# Patient Record
Sex: Female | Born: 1975 | Race: Black or African American | Hispanic: No | Smoking: Current every day smoker
Health system: Southern US, Community
[De-identification: ages and names within clinical notes are randomized; demographics above are authoritative.]

## PROBLEM LIST (undated history)

## (undated) DIAGNOSIS — N939 Abnormal uterine and vaginal bleeding, unspecified: Secondary | ICD-10-CM

## (undated) DIAGNOSIS — G629 Polyneuropathy, unspecified: Secondary | ICD-10-CM

## (undated) DIAGNOSIS — F419 Anxiety disorder, unspecified: Secondary | ICD-10-CM

## (undated) DIAGNOSIS — G43909 Migraine, unspecified, not intractable, without status migrainosus: Secondary | ICD-10-CM

## (undated) HISTORY — DX: Abnormal uterine and vaginal bleeding, unspecified: N93.9

## (undated) HISTORY — PX: TUBAL LIGATION: SHX77

## (undated) HISTORY — PX: STERILIZATION: SHX533

## (undated) HISTORY — DX: Anxiety disorder, unspecified: F41.9

## (undated) HISTORY — DX: Polyneuropathy, unspecified: G62.9

---

## 2002-06-22 ENCOUNTER — Emergency Department (HOSPITAL_COMMUNITY): Admission: EM | Admit: 2002-06-22 | Discharge: 2002-06-22 | Payer: Self-pay | Admitting: Emergency Medicine

## 2004-07-02 ENCOUNTER — Emergency Department (HOSPITAL_COMMUNITY): Admission: EM | Admit: 2004-07-02 | Discharge: 2004-07-02 | Payer: Self-pay | Admitting: Family Medicine

## 2004-07-10 ENCOUNTER — Emergency Department (HOSPITAL_COMMUNITY): Admission: EM | Admit: 2004-07-10 | Discharge: 2004-07-10 | Payer: Self-pay | Admitting: Emergency Medicine

## 2004-07-16 ENCOUNTER — Ambulatory Visit (HOSPITAL_BASED_OUTPATIENT_CLINIC_OR_DEPARTMENT_OTHER): Admission: RE | Admit: 2004-07-16 | Discharge: 2004-07-16 | Payer: Self-pay | Admitting: Orthopedic Surgery

## 2004-08-01 ENCOUNTER — Emergency Department (HOSPITAL_COMMUNITY): Admission: EM | Admit: 2004-08-01 | Discharge: 2004-08-02 | Payer: Self-pay | Admitting: Emergency Medicine

## 2004-08-07 ENCOUNTER — Ambulatory Visit: Payer: Self-pay | Admitting: Family Medicine

## 2004-11-04 ENCOUNTER — Encounter (INDEPENDENT_AMBULATORY_CARE_PROVIDER_SITE_OTHER): Payer: Self-pay | Admitting: *Deleted

## 2004-11-04 LAB — CONVERTED CEMR LAB

## 2004-11-13 ENCOUNTER — Other Ambulatory Visit: Admission: RE | Admit: 2004-11-13 | Discharge: 2004-11-13 | Payer: Self-pay | Admitting: Family Medicine

## 2004-11-13 ENCOUNTER — Encounter (INDEPENDENT_AMBULATORY_CARE_PROVIDER_SITE_OTHER): Payer: Self-pay | Admitting: Family Medicine

## 2004-11-13 ENCOUNTER — Ambulatory Visit: Payer: Self-pay | Admitting: Family Medicine

## 2004-12-18 ENCOUNTER — Encounter: Admission: RE | Admit: 2004-12-18 | Discharge: 2004-12-18 | Payer: Self-pay | Admitting: Sports Medicine

## 2004-12-18 ENCOUNTER — Ambulatory Visit: Payer: Self-pay | Admitting: Family Medicine

## 2005-02-05 ENCOUNTER — Ambulatory Visit: Payer: Self-pay | Admitting: Family Medicine

## 2005-04-29 ENCOUNTER — Ambulatory Visit: Payer: Self-pay | Admitting: Family Medicine

## 2005-07-18 ENCOUNTER — Ambulatory Visit: Payer: Self-pay | Admitting: Family Medicine

## 2005-08-06 ENCOUNTER — Ambulatory Visit: Payer: Self-pay | Admitting: Family Medicine

## 2005-08-12 ENCOUNTER — Encounter: Admission: RE | Admit: 2005-08-12 | Discharge: 2005-08-12 | Payer: Self-pay | Admitting: Family Medicine

## 2005-08-12 ENCOUNTER — Ambulatory Visit: Payer: Self-pay | Admitting: Family Medicine

## 2005-10-09 ENCOUNTER — Ambulatory Visit: Payer: Self-pay | Admitting: Family Medicine

## 2005-11-20 ENCOUNTER — Other Ambulatory Visit: Admission: RE | Admit: 2005-11-20 | Discharge: 2005-11-20 | Payer: Self-pay | Admitting: Family Medicine

## 2005-11-20 ENCOUNTER — Ambulatory Visit: Payer: Self-pay | Admitting: Family Medicine

## 2005-11-20 ENCOUNTER — Encounter (INDEPENDENT_AMBULATORY_CARE_PROVIDER_SITE_OTHER): Payer: Self-pay | Admitting: Specialist

## 2006-01-07 ENCOUNTER — Ambulatory Visit: Payer: Self-pay | Admitting: Family Medicine

## 2006-02-20 ENCOUNTER — Ambulatory Visit: Payer: Self-pay | Admitting: Family Medicine

## 2006-03-18 IMAGING — CR DG CHEST 2V
2 series · 2 of 2 positions shown · non-contrast
Comparison: No comparison.

CLINICAL DATA: Cough.  Headache. 
 CHEST ? TWO VIEWS 08/01/04:

[view not recorded (1 of 2)]
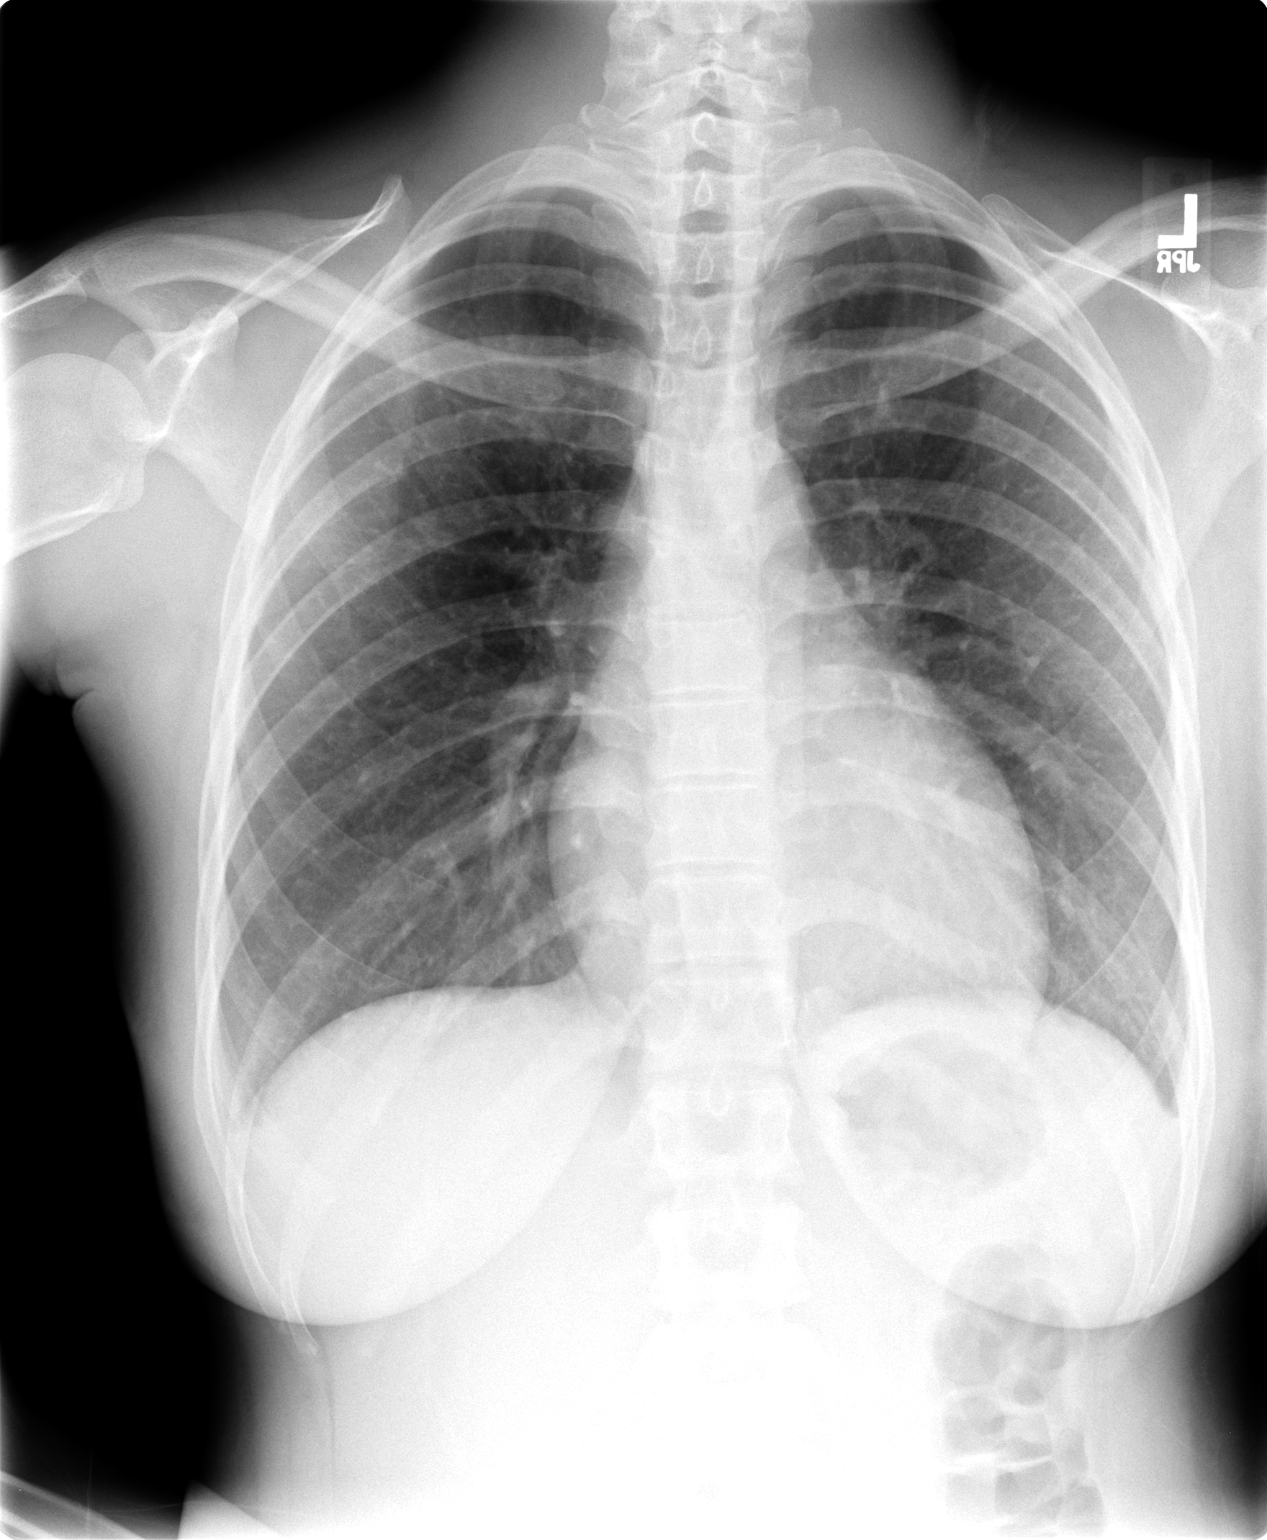

[view not recorded (2 of 2)]
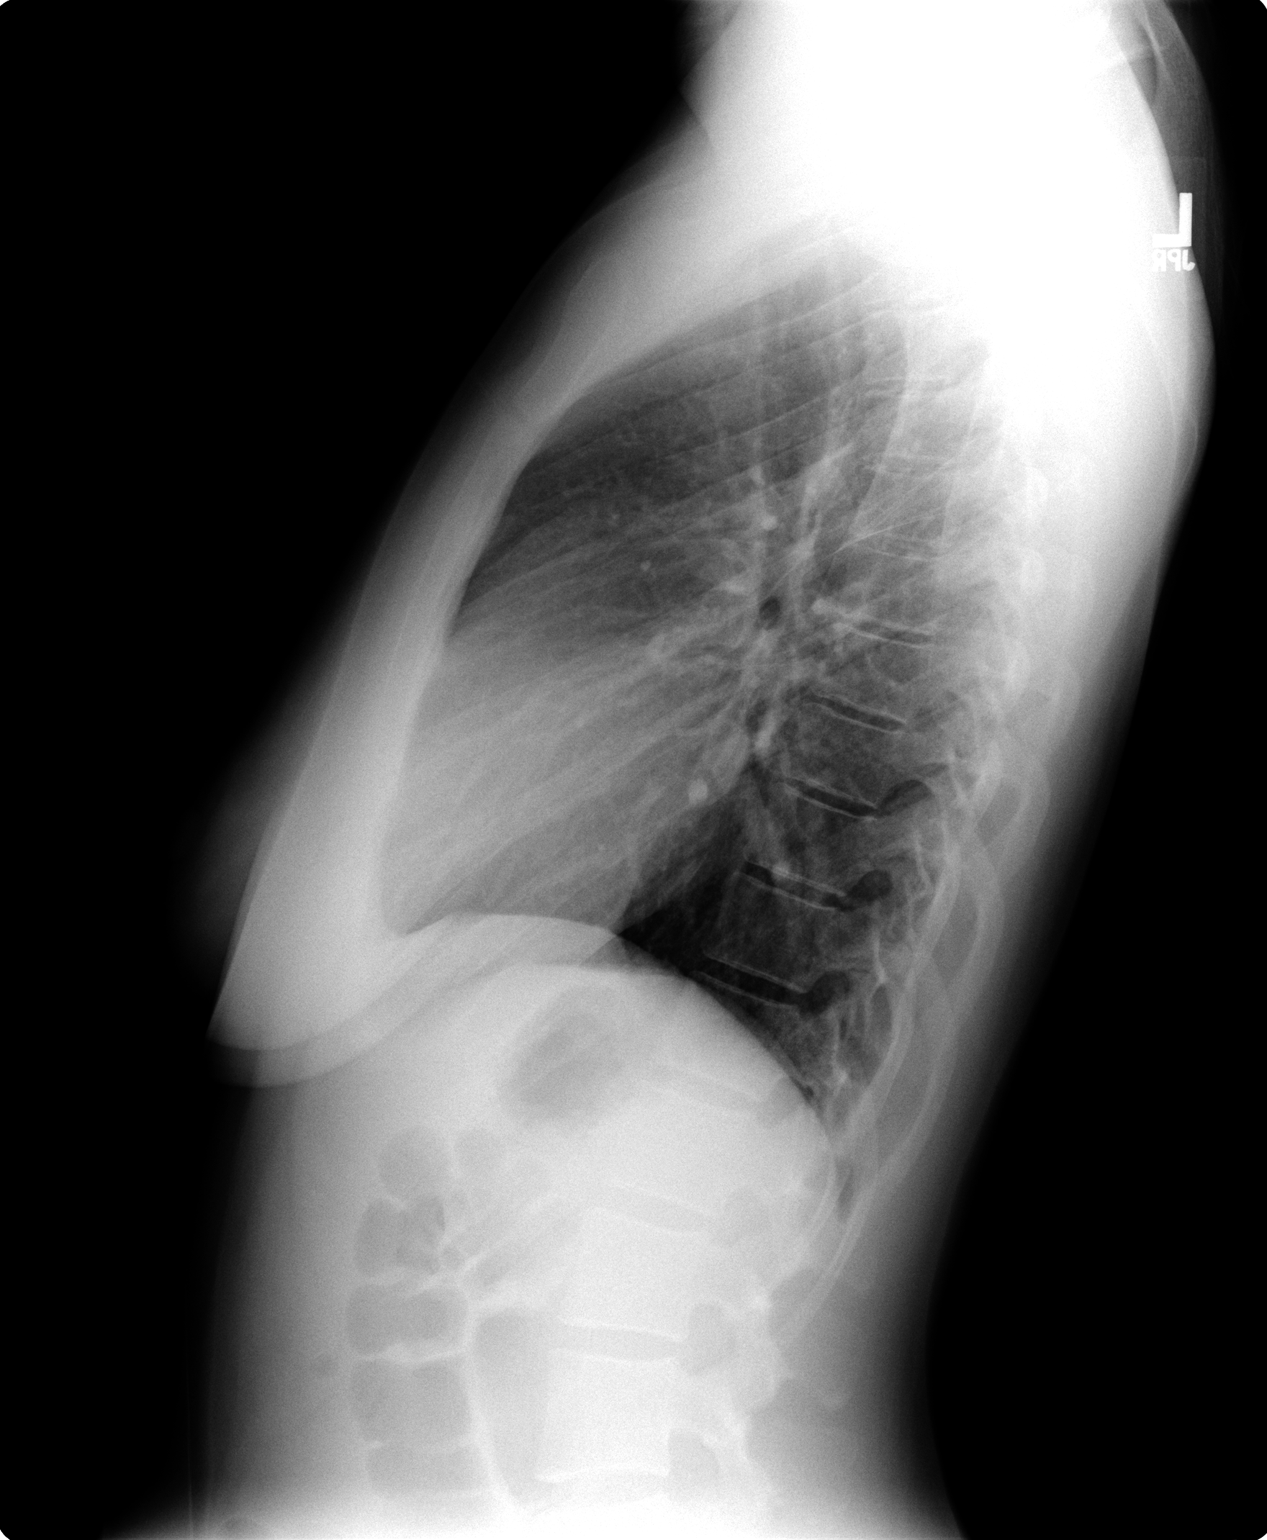

[2 of 2 positions shown; findings below may reference images not displayed]

FINDINGS: Heart size is upper normal.  There is no heart failure and the lungs are clear.
IMPRESSION: No active disease.

## 2006-04-03 ENCOUNTER — Ambulatory Visit: Payer: Self-pay | Admitting: Sports Medicine

## 2006-04-20 ENCOUNTER — Emergency Department (HOSPITAL_COMMUNITY): Admission: EM | Admit: 2006-04-20 | Discharge: 2006-04-21 | Payer: Self-pay | Admitting: Emergency Medicine

## 2006-08-14 ENCOUNTER — Emergency Department (HOSPITAL_COMMUNITY): Admission: EM | Admit: 2006-08-14 | Discharge: 2006-08-14 | Payer: Self-pay | Admitting: Emergency Medicine

## 2006-08-17 ENCOUNTER — Ambulatory Visit: Payer: Self-pay | Admitting: Family Medicine

## 2006-10-29 DIAGNOSIS — F329 Major depressive disorder, single episode, unspecified: Secondary | ICD-10-CM

## 2006-10-29 DIAGNOSIS — F17200 Nicotine dependence, unspecified, uncomplicated: Secondary | ICD-10-CM

## 2006-10-29 DIAGNOSIS — K589 Irritable bowel syndrome without diarrhea: Secondary | ICD-10-CM

## 2006-10-29 DIAGNOSIS — L708 Other acne: Secondary | ICD-10-CM

## 2006-10-29 DIAGNOSIS — F3289 Other specified depressive episodes: Secondary | ICD-10-CM | POA: Insufficient documentation

## 2006-10-30 ENCOUNTER — Encounter (INDEPENDENT_AMBULATORY_CARE_PROVIDER_SITE_OTHER): Payer: Self-pay | Admitting: *Deleted

## 2007-03-17 ENCOUNTER — Encounter: Payer: Self-pay | Admitting: Family Medicine

## 2007-03-17 ENCOUNTER — Ambulatory Visit: Payer: Self-pay | Admitting: Family Medicine

## 2007-03-17 LAB — CONVERTED CEMR LAB
Beta hcg, urine, semiquantitative: NEGATIVE
Bilirubin Urine: NEGATIVE
Chlamydia, DNA Probe: NEGATIVE
GC Probe Amp, Genital: NEGATIVE
Glucose, Urine, Semiquant: NEGATIVE
HCT: 39.6 % (ref 36.0–46.0)
Hemoglobin: 13.6 g/dL (ref 12.0–15.0)
KOH Prep: NEGATIVE
Ketones, urine, test strip: NEGATIVE
MCHC: 34.3 g/dL (ref 30.0–36.0)
MCV: 96.4 fL (ref 78.0–100.0)
Nitrite: NEGATIVE
Platelets: 250 10*3/uL (ref 150–400)
RBC: 4.11 M/uL (ref 3.87–5.11)
RDW: 12.2 % (ref 11.5–14.0)
Specific Gravity, Urine: 1.01
Urobilinogen, UA: 0.2
WBC: 5 10*3/uL (ref 4.0–10.5)
Whiff Test: POSITIVE
pH: 7.5

## 2007-04-23 ENCOUNTER — Emergency Department (HOSPITAL_COMMUNITY): Admission: EM | Admit: 2007-04-23 | Discharge: 2007-04-23 | Payer: Self-pay | Admitting: Emergency Medicine

## 2007-06-07 ENCOUNTER — Encounter: Payer: Self-pay | Admitting: *Deleted

## 2007-06-07 ENCOUNTER — Telehealth (INDEPENDENT_AMBULATORY_CARE_PROVIDER_SITE_OTHER): Payer: Self-pay | Admitting: *Deleted

## 2007-06-07 ENCOUNTER — Encounter: Payer: Self-pay | Admitting: Family Medicine

## 2007-06-07 ENCOUNTER — Ambulatory Visit: Payer: Self-pay | Admitting: Sports Medicine

## 2007-06-08 ENCOUNTER — Ambulatory Visit: Payer: Self-pay | Admitting: Family Medicine

## 2007-06-08 ENCOUNTER — Telehealth (INDEPENDENT_AMBULATORY_CARE_PROVIDER_SITE_OTHER): Payer: Self-pay | Admitting: *Deleted

## 2007-06-08 ENCOUNTER — Encounter (INDEPENDENT_AMBULATORY_CARE_PROVIDER_SITE_OTHER): Payer: Self-pay | Admitting: *Deleted

## 2007-06-09 ENCOUNTER — Telehealth: Payer: Self-pay | Admitting: Family Medicine

## 2007-06-10 ENCOUNTER — Encounter: Payer: Self-pay | Admitting: Family Medicine

## 2007-06-23 ENCOUNTER — Telehealth: Payer: Self-pay | Admitting: Family Medicine

## 2007-10-05 ENCOUNTER — Telehealth: Payer: Self-pay | Admitting: Family Medicine

## 2007-10-11 ENCOUNTER — Emergency Department (HOSPITAL_COMMUNITY): Admission: EM | Admit: 2007-10-11 | Discharge: 2007-10-12 | Payer: Self-pay | Admitting: Emergency Medicine

## 2007-10-14 ENCOUNTER — Ambulatory Visit: Payer: Self-pay | Admitting: Family Medicine

## 2007-10-14 ENCOUNTER — Encounter: Payer: Self-pay | Admitting: Family Medicine

## 2007-10-14 LAB — CONVERTED CEMR LAB
Antibody Screen: NEGATIVE
Antibody Screen: NEGATIVE
Basophils Absolute: 0 10*3/uL (ref 0.0–0.1)
Basophils Relative: 0 % (ref 0–1)
Beta hcg, urine, semiquantitative: POSITIVE
Eosinophils Absolute: 0.1 10*3/uL (ref 0.0–0.7)
Eosinophils Relative: 1 % (ref 0–5)
HCT: 34.3 %
HCT: 34.3 % — ABNORMAL LOW (ref 36.0–46.0)
Hemoglobin: 11.5 g/dL
Hemoglobin: 11.5 g/dL — ABNORMAL LOW (ref 12.0–15.0)
Hepatitis B Surface Ag: NEGATIVE
Lymphocytes Relative: 28 % (ref 12–46)
Lymphs Abs: 1.7 10*3/uL (ref 0.7–4.0)
MCHC: 33.5 g/dL (ref 30.0–36.0)
MCV: 94.2 fL (ref 78.0–100.0)
Monocytes Absolute: 0.8 10*3/uL (ref 0.1–1.0)
Monocytes Relative: 14 % — ABNORMAL HIGH (ref 3–12)
Neutro Abs: 3.4 10*3/uL (ref 1.7–7.7)
Neutrophils Relative %: 57 % (ref 43–77)
Platelets: 280 10*3/uL (ref 150–400)
RBC: 3.64 M/uL — ABNORMAL LOW (ref 3.87–5.11)
RDW: 12.1 % (ref 11.5–15.5)
Rh Type: NEGATIVE
Rh Type: POSITIVE
Rubella: 83.7 intl units/mL — ABNORMAL HIGH
Sickle Cell Screen: NEGATIVE
WBC: 5.9 10*3/uL (ref 4.0–10.5)

## 2007-10-15 ENCOUNTER — Encounter: Payer: Self-pay | Admitting: Family Medicine

## 2007-10-20 ENCOUNTER — Ambulatory Visit (HOSPITAL_COMMUNITY): Admission: RE | Admit: 2007-10-20 | Discharge: 2007-10-20 | Payer: Self-pay | Admitting: Family Medicine

## 2007-10-28 ENCOUNTER — Ambulatory Visit: Payer: Self-pay | Admitting: Family Medicine

## 2007-10-28 ENCOUNTER — Encounter: Payer: Self-pay | Admitting: Family Medicine

## 2007-10-28 LAB — CONVERTED CEMR LAB
Chlamydia, DNA Probe: NEGATIVE
GC Probe Amp, Genital: NEGATIVE
Glucose, Urine, Semiquant: NEGATIVE
Protein, U semiquant: NEGATIVE

## 2007-10-29 ENCOUNTER — Encounter: Payer: Self-pay | Admitting: Family Medicine

## 2007-11-03 ENCOUNTER — Ambulatory Visit (HOSPITAL_COMMUNITY): Admission: RE | Admit: 2007-11-03 | Discharge: 2007-11-03 | Payer: Self-pay | Admitting: Family Medicine

## 2007-11-03 ENCOUNTER — Encounter: Payer: Self-pay | Admitting: Family Medicine

## 2007-12-01 ENCOUNTER — Encounter: Payer: Self-pay | Admitting: *Deleted

## 2007-12-01 ENCOUNTER — Ambulatory Visit (HOSPITAL_COMMUNITY): Admission: RE | Admit: 2007-12-01 | Discharge: 2007-12-01 | Payer: Self-pay | Admitting: Family Medicine

## 2007-12-02 ENCOUNTER — Ambulatory Visit: Payer: Self-pay | Admitting: Family Medicine

## 2007-12-02 LAB — CONVERTED CEMR LAB: Glucose, Urine, Semiquant: NEGATIVE

## 2007-12-07 ENCOUNTER — Ambulatory Visit: Payer: Self-pay | Admitting: Family Medicine

## 2007-12-07 LAB — CONVERTED CEMR LAB: Glucose, Urine, Semiquant: NEGATIVE

## 2008-01-03 ENCOUNTER — Telehealth: Payer: Self-pay | Admitting: Family Medicine

## 2008-01-13 ENCOUNTER — Ambulatory Visit: Payer: Self-pay | Admitting: Family Medicine

## 2008-01-13 DIAGNOSIS — F39 Unspecified mood [affective] disorder: Secondary | ICD-10-CM | POA: Insufficient documentation

## 2008-01-13 LAB — CONVERTED CEMR LAB
Glucose, Urine, Semiquant: NEGATIVE
Protein, U semiquant: NEGATIVE

## 2008-01-19 ENCOUNTER — Ambulatory Visit (HOSPITAL_COMMUNITY): Admission: RE | Admit: 2008-01-19 | Discharge: 2008-01-19 | Payer: Self-pay | Admitting: Family Medicine

## 2008-01-19 ENCOUNTER — Encounter: Payer: Self-pay | Admitting: Family Medicine

## 2008-01-25 ENCOUNTER — Encounter (INDEPENDENT_AMBULATORY_CARE_PROVIDER_SITE_OTHER): Payer: Self-pay | Admitting: *Deleted

## 2008-03-08 ENCOUNTER — Ambulatory Visit: Payer: Self-pay | Admitting: Family Medicine

## 2008-03-08 ENCOUNTER — Encounter: Payer: Self-pay | Admitting: Family Medicine

## 2008-03-08 LAB — CONVERTED CEMR LAB
Glucose, Urine, Semiquant: NEGATIVE
HCT: 29.4 % — ABNORMAL LOW (ref 36.0–46.0)
Hemoglobin: 9.9 g/dL — ABNORMAL LOW (ref 12.0–15.0)
MCHC: 33.7 g/dL (ref 30.0–36.0)
MCV: 95.8 fL (ref 78.0–100.0)
Platelets: 222 10*3/uL (ref 150–400)
Protein, U semiquant: NEGATIVE
RBC: 3.07 M/uL — ABNORMAL LOW (ref 3.87–5.11)
RDW: 12.9 % (ref 11.5–15.5)
WBC: 11.3 10*3/uL — ABNORMAL HIGH (ref 4.0–10.5)

## 2008-03-09 ENCOUNTER — Encounter: Payer: Self-pay | Admitting: Family Medicine

## 2008-03-23 ENCOUNTER — Ambulatory Visit: Payer: Self-pay | Admitting: Sports Medicine

## 2008-03-23 ENCOUNTER — Encounter (INDEPENDENT_AMBULATORY_CARE_PROVIDER_SITE_OTHER): Payer: Self-pay | Admitting: *Deleted

## 2008-03-23 ENCOUNTER — Encounter: Payer: Self-pay | Admitting: Family Medicine

## 2008-03-23 LAB — CONVERTED CEMR LAB: Glucose, Urine, Semiquant: NEGATIVE

## 2008-03-24 ENCOUNTER — Encounter: Payer: Self-pay | Admitting: Family Medicine

## 2008-03-24 LAB — CONVERTED CEMR LAB
BUN: 6 mg/dL (ref 6–23)
CO2: 20 meq/L (ref 19–32)
Chloride: 106 meq/L (ref 96–112)
Glucose, Bld: 79 mg/dL (ref 70–99)
Potassium: 4.2 meq/L (ref 3.5–5.3)

## 2008-04-03 ENCOUNTER — Ambulatory Visit: Payer: Self-pay | Admitting: Family Medicine

## 2008-04-03 LAB — CONVERTED CEMR LAB: Protein, U semiquant: NEGATIVE

## 2008-04-04 ENCOUNTER — Encounter: Payer: Self-pay | Admitting: Family Medicine

## 2008-04-12 ENCOUNTER — Encounter: Payer: Self-pay | Admitting: Family Medicine

## 2008-04-18 ENCOUNTER — Ambulatory Visit: Payer: Self-pay | Admitting: Family Medicine

## 2008-04-18 ENCOUNTER — Encounter: Payer: Self-pay | Admitting: Family Medicine

## 2008-04-18 ENCOUNTER — Encounter (INDEPENDENT_AMBULATORY_CARE_PROVIDER_SITE_OTHER): Payer: Self-pay | Admitting: *Deleted

## 2008-04-18 LAB — CONVERTED CEMR LAB
Chlamydia, DNA Probe: NEGATIVE
GC Probe Amp, Genital: NEGATIVE

## 2008-04-19 ENCOUNTER — Encounter: Payer: Self-pay | Admitting: Family Medicine

## 2008-04-25 ENCOUNTER — Ambulatory Visit: Payer: Self-pay | Admitting: Family Medicine

## 2008-04-25 LAB — CONVERTED CEMR LAB
Glucose, Urine, Semiquant: NEGATIVE
Whiff Test: NEGATIVE

## 2008-05-04 ENCOUNTER — Ambulatory Visit: Payer: Self-pay | Admitting: Family Medicine

## 2008-05-11 ENCOUNTER — Ambulatory Visit: Payer: Self-pay | Admitting: Family Medicine

## 2008-05-12 ENCOUNTER — Ambulatory Visit: Payer: Self-pay | Admitting: Obstetrics and Gynecology

## 2008-05-12 ENCOUNTER — Inpatient Hospital Stay (HOSPITAL_COMMUNITY): Admission: AD | Admit: 2008-05-12 | Discharge: 2008-05-13 | Payer: Self-pay | Admitting: Obstetrics & Gynecology

## 2008-05-17 ENCOUNTER — Inpatient Hospital Stay (HOSPITAL_COMMUNITY): Admission: AD | Admit: 2008-05-17 | Discharge: 2008-05-18 | Payer: Self-pay | Admitting: Obstetrics & Gynecology

## 2008-05-17 ENCOUNTER — Encounter (INDEPENDENT_AMBULATORY_CARE_PROVIDER_SITE_OTHER): Payer: Self-pay | Admitting: Gynecology

## 2008-05-17 ENCOUNTER — Ambulatory Visit: Payer: Self-pay | Admitting: Family Medicine

## 2008-05-31 ENCOUNTER — Ambulatory Visit: Payer: Self-pay | Admitting: Family Medicine

## 2008-06-26 ENCOUNTER — Ambulatory Visit: Payer: Self-pay | Admitting: Family Medicine

## 2008-06-26 ENCOUNTER — Encounter: Payer: Self-pay | Admitting: Family Medicine

## 2008-06-26 LAB — CONVERTED CEMR LAB: Pap Smear: NORMAL

## 2008-06-27 ENCOUNTER — Telehealth: Payer: Self-pay | Admitting: Family Medicine

## 2008-06-28 ENCOUNTER — Ambulatory Visit: Payer: Self-pay | Admitting: Family Medicine

## 2008-06-30 ENCOUNTER — Encounter: Payer: Self-pay | Admitting: Family Medicine

## 2008-07-10 ENCOUNTER — Encounter: Payer: Self-pay | Admitting: Family Medicine

## 2008-07-10 ENCOUNTER — Ambulatory Visit: Payer: Self-pay | Admitting: Family Medicine

## 2008-07-10 DIAGNOSIS — Z862 Personal history of diseases of the blood and blood-forming organs and certain disorders involving the immune mechanism: Secondary | ICD-10-CM

## 2008-07-10 LAB — CONVERTED CEMR LAB
Iron: 72 ug/dL (ref 42–145)
Platelets: 282 10*3/uL (ref 150–400)
RBC: 3.87 M/uL (ref 3.87–5.11)
Saturation Ratios: 23 % (ref 20–55)
TIBC: 316 ug/dL (ref 250–470)
UIBC: 244 ug/dL
WBC: 5 10*3/uL (ref 4.0–10.5)

## 2008-08-29 ENCOUNTER — Ambulatory Visit: Payer: Self-pay | Admitting: Family Medicine

## 2008-08-29 ENCOUNTER — Encounter: Payer: Self-pay | Admitting: Family Medicine

## 2008-08-31 ENCOUNTER — Ambulatory Visit: Payer: Self-pay | Admitting: Family Medicine

## 2009-03-27 ENCOUNTER — Telehealth: Payer: Self-pay | Admitting: Family Medicine

## 2009-03-28 ENCOUNTER — Ambulatory Visit: Payer: Self-pay | Admitting: Family Medicine

## 2009-03-28 DIAGNOSIS — F341 Dysthymic disorder: Secondary | ICD-10-CM

## 2009-03-28 DIAGNOSIS — G4700 Insomnia, unspecified: Secondary | ICD-10-CM

## 2009-04-16 ENCOUNTER — Ambulatory Visit: Payer: Self-pay | Admitting: Family Medicine

## 2009-05-15 ENCOUNTER — Ambulatory Visit: Payer: Self-pay | Admitting: Family Medicine

## 2009-05-18 ENCOUNTER — Encounter: Payer: Self-pay | Admitting: Family Medicine

## 2009-05-22 ENCOUNTER — Ambulatory Visit: Payer: Self-pay | Admitting: Psychology

## 2009-10-22 ENCOUNTER — Encounter: Payer: Self-pay | Admitting: Family Medicine

## 2009-10-22 ENCOUNTER — Ambulatory Visit: Payer: Self-pay | Admitting: Family Medicine

## 2009-10-22 ENCOUNTER — Other Ambulatory Visit: Admission: RE | Admit: 2009-10-22 | Discharge: 2009-10-22 | Payer: Self-pay | Admitting: Family Medicine

## 2009-10-22 LAB — CONVERTED CEMR LAB
Chlamydia, DNA Probe: NEGATIVE
GC Probe Amp, Genital: NEGATIVE

## 2009-10-24 ENCOUNTER — Encounter: Payer: Self-pay | Admitting: Family Medicine

## 2009-10-31 ENCOUNTER — Encounter: Payer: Self-pay | Admitting: Family Medicine

## 2009-12-22 ENCOUNTER — Emergency Department (HOSPITAL_COMMUNITY): Admission: EM | Admit: 2009-12-22 | Discharge: 2009-12-23 | Payer: Self-pay | Admitting: Emergency Medicine

## 2010-01-01 ENCOUNTER — Ambulatory Visit: Payer: Self-pay | Admitting: Family Medicine

## 2010-02-17 ENCOUNTER — Emergency Department (HOSPITAL_COMMUNITY): Admission: EM | Admit: 2010-02-17 | Discharge: 2010-02-17 | Payer: Self-pay | Admitting: Family Medicine

## 2010-02-20 ENCOUNTER — Telehealth: Payer: Self-pay | Admitting: Family Medicine

## 2010-07-02 ENCOUNTER — Encounter: Payer: Self-pay | Admitting: Family Medicine

## 2010-10-01 NOTE — Progress Notes (Signed)
Summary: triage  Phone Note Call from Patient Call back at Home Phone [phone]   Caller: Patient Summary of Call: has a bad cold/ ear pain/congestion - wants to come in today Initial call taken by: De Nurse,  February 20, 2010 10:28 AM  Follow-up for Phone Call        LM Follow-up by: Golden Circle RN,  February 20, 2010 10:33 AM  Additional Follow-up for Phone Call Additional follow up Details #1::        went to [facility] sunday. was told she has an URI. upset that she did not get antibiotics. told her no needed usually. now has L ear pain. taking ibu. also c/o sinus congestion. she is taking claritin. cannot make it in today. appt at 11am for work in tomorrow. told her to keep taking the ibu with food, keep taking the claritin. may sit in bathroom with hot water on to create a steamy environment. she agreed with plan I called [facility] to get records faxed here from that visit Additional Follow-up by: Golden Circle RN,  February 20, 2010 10:35 AM     Appended Document: triage notes from [facility] are in pcp chart box

## 2010-10-01 NOTE — Assessment & Plan Note (Signed)
Summary: cpe/pap,tcb   Vital Signs:  Patient profile:   35 year old female Height:      63.5 inches Weight:      121 pounds BMI:     21.17 Pulse rate:   92 / minute BP sitting:   134 / 89  (right arm)  Vitals Entered By: Arlyss Repress CMA, (October 22, 2009 9:17 AM) CC: physical with pap. Is Patient Diabetic? No Pain Assessment Patient in pain? no        CC:  physical with pap.Marland Kitchen  History of Present Illness: Patient for physical, verbalizes continued failed attempt to quit smoking, reports she has tried the patch, gum and abstinence method in the past without success.  States she was in a quit smoking program and it helped her more than anything.  Wishes to be re-enrolled.  Previously on Zoloft for anxiety and depression, states she no longer takes medication but is interested in Wellbutrin because it helps with smoking.  Complaints of continued episodes of "boils" on inner thighs and buttocks.  States she has been on antibiotics in the past with good results.  No change in skin routine, reports she takes bath twice weekly.    Habits & Providers  Alcohol-Tobacco-Diet     Tobacco Status: current     Tobacco Counseling: to quit use of tobacco products  Comments: trying to quitt. smokes 5 cig/daily  Current Medications (verified): 1)  Zoloft 50 Mg Tabs (Sertraline Hcl) .... 1/2 Tab For 2 Days, Then One Tab Daily.  Allergies (verified): 1)  ! Naprosyn  Social History: Lives with husband Melanie Nguyen) and 2 sons (Melanie Nguyen and Melanie Nguyen,and daughter Melanie Nguyen + tobacco 1ppd x 12 yrs, occ [place], no drug use; unemployed.  Originally from [place], [place].  10/28/2007--currently not drinking EtOH, smokes 1/4 ppd - up to 1ppd due to social stressors  10/22/2009--currently smoking >1ppd, occ. ETOH use, no use drug  Review of Systems General:  Denies chills, fatigue, fever, loss of appetite, malaise, sweats, and weight loss. Eyes:  Denies blurring, double vision, eye  irritation, eye pain, and light sensitivity; Sees eye doctor yearly, wears glasses. ENT:  Complains of hoarseness; denies decreased hearing, difficulty swallowing, nasal congestion, ringing in ears, sinus pressure, and sore throat. CV:  Denies bluish discoloration of lips or nails, chest pain or discomfort, difficulty breathing at night, difficulty breathing while lying down, fainting, fatigue, lightheadness, palpitations, and shortness of breath with exertion. Resp:  Complains of cough; denies chest discomfort, coughing up blood, excessive snoring, shortness of breath, and sputum productive. GI:  Denies abdominal pain, bloody stools, change in bowel habits, constipation, loss of appetite, nausea, and vomiting. GU:  Denies abnormal vaginal bleeding, discharge, dysuria, hematuria, nocturia, and urinary frequency. MS:  Denies joint pain, joint redness, joint swelling, low back pain, muscle aches, and muscle weakness. Derm:  Denies changes in color of skin, changes in nail beds, dryness, hair loss, and rash. Neuro:  Denies brief paralysis, difficulty with concentration, headaches, inability to speak, memory loss, numbness, sensation of room spinning, tingling, visual disturbances, and weakness. Psych:  Complains of anxiety and depression; denies easily tearful, irritability, mental problems, panic attacks, sense of great danger, suicidal thoughts/plans, thoughts of violence, and thoughts /plans of harming others. Endo:  Denies cold intolerance, excessive hunger, excessive thirst, excessive urination, and heat intolerance. Heme:  Denies abnormal bruising, bleeding, and skin discoloration.  Physical Exam  General:  Well-developed,well-nourished,in no acute distress; alert,appropriate and cooperative throughout examination Head:  Normocephalic and atraumatic  without obvious abnormalities.  Eyes:  No corneal or conjunctival inflammation noted. EOMI. Perrla. Vision intact, wears glasses. Ears:  External  ear exam shows no significant lesions or deformities.  Otoscopic examination reveals clear canals, tympanic membranes are intact bilaterally without bulging, retraction, inflammation or discharge. Hearing is grossly normal bilaterally. Nose:  External nasal examination shows no deformity or inflammation. Nasal mucosa are pink and moist without lesions or exudates. Mouth:  Oral mucosa and oropharynx without lesions or exudates.  fair dentition.   Neck:  No deformities, masses, or tenderness noted. Chest Wall:  No deformities, masses, or tenderness noted. Breasts:  No mass, nodules, thickening, tenderness, bulging, retraction, inflamation, nipple discharge or skin changes noted.   Lungs:  Normal respiratory effort, chest expands symmetrically. Lungs are clear to auscultation, no crackles or wheezes. Heart:  Normal rate and regular rhythm. S1 and S2 normal without gallop, murmur, click, rub or other extra sounds. Abdomen:  Bowel sounds positive,abdomen soft and non-tender without masses, organomegaly or hernias noted. Genitalia:  Normal introitus for age, no external lesions, bloody vaginal discharge, mucosa pink and moist, no vaginal or cervical lesions, normal uterus size and position, no adnexal masses or tenderness Msk:  No deformity or scoliosis noted of thoracic or lumbar spine.   Pulses:  R and L carotid,radial,femoral,dorsalis pedis and posterior tibial pulses are full and equal bilaterally Extremities:  No clubbing, cyanosis, edema, or deformity noted with normal full range of motion of all joints.   Neurologic:  No cranial nerve deficits noted. Station and gait are normal. Plantar reflexes are down-going bilaterally. DTRs are symmetrical throughout. Sensory, motor and coordinative functions appear intact. Skin:  Intact without suspicious lesions or rashes Cervical Nodes:  No lymphadenopathy noted Inguinal Nodes:  No significant adenopathy Psych:  Cognition and judgment appear intact. Alert  and cooperative with normal attention span and concentration. No apparent delusions, illusions, hallucinations   Impression & Recommendations:  Problem # 1:  Gynecological examination-routine (ICD-V72.31) Routine CPE with pap.  Awaiting results of Pap   Problem # 2:  DEPRESSIVE DISORDER, NOS (ICD-311) Previously on Zoloft but will change to Wellbutrin for added assistance of smoking cessation. The following medications were removed from the medication list:    Zoloft 50 Mg Tabs (Sertraline hcl) .Marland Kitchen... 1/2 tab for 2 days, then one tab daily. Her updated medication list for this problem includes:    Wellbutrin Xl 150 Mg Xr24h-tab (Bupropion hcl) ..... One q am  Problem # 3:  TOBACCO DEPENDENCE (ICD-305.1)  Discussed, education pamphlet provided, information provided to enroll in smoking cessation class.  Orders: [facility] - Est  18-39 yrs ([id number])  Problem # 4:  ABSCESS, SKIN (ICD-682.9)  Reports of continued skin abcess.  Will try Phisohex to prevent occurences.  Education provided in regards to baths and when should seek health care provider.  Orders: [facility] - Est  18-39 yrs ([address])  Complete Medication List: 1)  Phisohex 3 % Liqd (Hexachlorophene) .... Use with bath twice a week. 2)  Wellbutrin Xl 150 Mg Xr24h-tab (Bupropion hcl) .... One q am  Other Orders: GC/Chlamydia-[facility] ([id number]) Pap Smear-[facility] ([id number])  Patient Instructions: 1)  Await results of pap and other tests. 2)  Self Breast Exams (SBE) montly. 3)  Take medication as instructed. 4)  Please attempt to get into the smoking cessation classes. Prescriptions: WELLBUTRIN XL 150 MG XR24H-TAB (BUPROPION HCL) one q am  #30 x 6   Entered and Authorized by:   Luretha Murphy NP   Signed by:  Luretha Murphy NP on 10/22/2009   Method used:   Electronically to        [id number] (retail)       [address] [place], [place]  [address]       Ph: [phone]       Fax: [phone]   RxID:    [id number] PHISOHEX 3 % LIQD (HEXACHLOROPHENE) Use with bath twice a week.  #1 bottle x 3   Entered and Authorized by:   Luretha Murphy NP   Signed by:   Luretha Murphy NP on 10/22/2009   Method used:   Electronically to        [id number] (retail)       [address] [place], [place]  [address]       Ph: [phone]       Fax: [phone]   RxID:   [id number]
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
## 2010-10-01 NOTE — Letter (Signed)
Summary: Generic Letter  [facility]  [address]   [place], [place] [address]   Phone: [phone]  Fax: [phone]    10/24/2009  PEYTAN ANDRINGA [address] [place], [place]  [address]  Dear Ms. Pickney,    All testing was negative.       Sincerely,   Luretha Murphy NP  Appended Document: Generic Letter mailed.

## 2010-10-01 NOTE — Miscellaneous (Signed)
  Clinical Lists Changes  Problems: Removed problem of PHYSICAL EXAMINATION (ICD-V70.0) Removed problem of ABSCESS, SKIN (ICD-682.9) Removed problem of CONTACT OR EXPOSURE TO OTHER VIRAL DISEASES (ICD-V01.79) Removed problem of HEADACHE (ICD-784.0) Removed problem of SCREENING FOR MALIGNANT NEOPLASM OF THE CERVIX (ICD-V76.2) Removed problem of SCREENING FOR MALIGNANT NEOPLASM OF THE CERVIX (ICD-V76.2)

## 2010-10-01 NOTE — Miscellaneous (Signed)
  Clinical Lists Changes  Problems: Added new problem of PHYSICAL EXAMINATION (ICD-V70.0) Orders: Added new Test order of [facility] - Est  18-39 yrs [phone]) - Signed

## 2011-01-14 NOTE — Op Note (Signed)
Melanie, Nguyen              ACCOUNT NO.:  [id number]   MEDICAL RECORD NO.:  [id number]          PATIENT TYPE:  INP   LOCATION:  9137                          FACILITY:  [facility]   PHYSICIAN:  Ginger Carne, MD  DATE OF BIRTH:  December 16, 1975   DATE OF PROCEDURE:  05/17/2008  DATE OF DISCHARGE:                               OPERATIVE REPORT   PREOPERATIVE DIAGNOSES:  1. Multiparity.  2. Undesired fertility.   POSTOPERATIVE DIAGNOSES:  1. Multiparity.  2. Undesired fertility.   PROCEDURE:  Bilateral tubal ligation via modified Pomeroy technique.   SURGEON:  Ginger Carne, MD.   ASSISTANT:  Odie Sera, DO.   ANESTHESIA:  Epidural and local.   INDICATIONS FOR PROCEDURE:  Melanie Nguyen is a 35 year old gravida  3, now para 3 who delivered an infant early this morning.  She  previously was consented and had signed tubal ligation papers on her  chart.  The risks and benefits of the procedure were explained to her to  include, but not limited to risk of failure being 5 to 7 in 1000 with an  increased risk for ectopic pregnancy should failure occur, additionally  bleeding, infection, and damage to nearby organs.  The patient  understood these risks and desired to proceed with the procedure.   DESCRIPTION OF PROCEDURE:  The patient was taken to the operating room  where appropriate epidural anesthesia was confirmed.  She was prepped  and draped in the usual sterile fashion.  Time-out was conducted.  Prior  to the procedure, the bladder was emptied of about 400 mL of clear  urine.  A vertical 3-cm skin incision was made just inferior to the  umbilicus.  The incision was extended bluntly down to the fascia.  The  fascia was grasped and cut in the midline with scissors.  The fascial  incision was extended inferiorly and superiorly with scissors.  The  peritoneum was entered bluntly and using [employer] retractors, the right  fallopian tube was visualized and grasped with  Babcock clamp.  The  grossly normal appearing fallopian tube was followed distally to the  fimbria and then proximally to approximate midway point.  The tube was  ligated using a modified Pomeroy technique using 2-0 chromic suture.  The portion of the tube was cut and sent to pathology.  Hemostasis was  good after some electrocautery.  The tube was then returned to the  abdomen.  Attention was brought to the left fallopian tube.  The tube  was visualized and grasped with Babcock clamp.  It was followed distally  to the fimbria, then proximally to approximate the midway point.  The  tube was also ligated using a modified Pomeroy technique with 2-0  chromic gut.  The portion of tube was cut and sent to pathology and  hemostasis was noted after little electrocautery.  The fascia was closed  with 2-0 Monocryl in a running non-interlocking fashion.  No defects  were noted.  Good hemostasis was noted.  Skin was closed with a  subcutaneous 3-0 Vicryl suture.  Dermabond was placed on the skin.  The  area of skin incision was injected with 5 mL of 0.25% Marcaine before  the procedure and another 5 mL of 0.25% Marcaine was injected into the  incision after the procedures well.   FINDINGS:  Bilateral grossly appearing oviducts.   SPECIMENS:  Bilateral excised oviducts.   DISPOSITION:  Specimens to Pathology.  Estimated blood loss was minimal.  Complications none immediate.  The patient was taken to the PACU in good  condition.      Odie Sera, DO  Electronically Signed     ______________________________  Ginger Carne, MD    MC/MEDQ  D:  05/17/2008  T:  05/18/2008  Job:  [id number]

## 2011-01-17 NOTE — Op Note (Signed)
NAME:  Melanie Nguyen, Melanie Nguyen          ACCOUNT NO.:  [id number]   MEDICAL RECORD NO.:  [id number]          PATIENT TYPE:  AMB   LOCATION:  DSC                          FACILITY:  [facility]   PHYSICIAN:  Katy Fitch. Sypher Montez Hageman., M.D.DATE OF BIRTH:  [birth year]   DATE OF PROCEDURE:  07/16/2004  DATE OF DISCHARGE:                                 OPERATIVE REPORT   PREOPERATIVE DIAGNOSIS:  Unstable spiral oblique fracture of left small  finger proximal phalanx.   POSTOPERATIVE DIAGNOSIS:  Unstable spiral oblique fracture of left small  finger proximal phalanx.   OPERATION:  Open reduction and internal fixation of left small finger  proximal phalanx fracture utilizing three 1.5 mm ASIF lag screws.   SURGEON:  Katy Fitch. Sypher, M.D.   ASSISTANT:  Marveen Reeks. Dasnoit, P.A.-C.   ANESTHESIA:  General by LMA.   ANESTHESIOLOGIST:  Janetta Hora. Gelene Mink, M.D.   INDICATIONS FOR PROCEDURE:  Melanie Nguyen is a 35 year old woman  referred by the [facility] emergency room for evaluation  and management of an unstable fracture of her left small finger proximal  phalanx.  She had sustained an injury while playing with her sons.  She was  seen at the emergency room where x-rays revealed a displaced fracture.  She  was splinted and referred to our office for follow up.  She was seen for  consultation and noted to have an unstable P1 fracture.  We recommended open  reduction and internal fixation at this time to facilitate obtaining and  maintaining an anatomic reduction and also to allow early active range of  motion exercises to prevent stiffness.  After informed consent, she is  brought to the operating room at this time.   PROCEDURE:  Melanie Nguyen is brought to the operating room and placed  on supine position on the operating table.  Following the induction of  general anesthesia by LMA, the left arm was prepped with Betadine solution  and sterilely draped.  A pneumatic  tourniquet was applied to the proximal  brachium.  1 gram Ancef was administered as an IV prophylactic antibiotic.  The procedure commenced with a curvilinear incision over the dorsal aspect  of the left small finger P1 segment.  The subcutaneous tissues were  carefully divided revealing the extensor mechanism.  This was split in the  midline allowing exposure of the periosteum.  The periosteum was split  longitudinally and elevated carefully revealing the fracture site.  The  fracture was then opened by rotating the distal segment of the fracture.  The fracture site was irrigated and clot was removed with the microcuret and  forceps.  The fracture was then anatomically reduced and secured with three  1.5 mm lag screws.  The fracture construct was quite secure.  She was noted  to have full range of motion of her PIP joint, 0 to 120 degrees motion.  The  periosteum was then repaired with a running suture of 4-0 Vicryl followed by  repair of the extensor mechanism with segmental 4-0 Mersilene and repair of  the skin with intradermal 3-0 Prolene.  A compressive gauze dressing was  applied followed by application of a safe position ulnar sided splint  incorporating the ring and small fingers in the safe position.  There were  no apparent complications.   Melanie Nguyen was transferred to the recovery room with stable vital signs.  She will be discharged with prescriptions for Percocet 5 mg 1 p.o. q.4-6h.  p.r.n. pain, 30 tablets without refill, also Keflex 500 mg 1 p.o. q.8h. x  four days as a prophylactic antibiotic.  She is advised to use Ibuprofen in  the form of Advil 2-3 tablets p.o. q.6h. p.r.n. pain.      Melanie Nguyen   RVS/MEDQ  D:  07/16/2004  T:  07/16/2004  Job:  [id number]

## 2011-06-02 LAB — CBC
HCT: 30.9 — ABNORMAL LOW
MCHC: 33.6
MCV: 99.6
Platelets: 198
RDW: 12.7

## 2011-06-02 LAB — RPR: RPR Ser Ql: NONREACTIVE

## 2013-10-24 ENCOUNTER — Encounter (HOSPITAL_COMMUNITY): Payer: Self-pay | Admitting: Emergency Medicine

## 2013-10-24 ENCOUNTER — Emergency Department (HOSPITAL_COMMUNITY)
Admission: EM | Admit: 2013-10-24 | Discharge: 2013-10-24 | Disposition: A | Discharge status: Home / Self Care | Payer: Self-pay | Attending: Emergency Medicine | Admitting: Emergency Medicine

## 2013-10-24 ENCOUNTER — Emergency Department (HOSPITAL_COMMUNITY): Payer: Self-pay

## 2013-10-24 DIAGNOSIS — R350 Frequency of micturition: Secondary | ICD-10-CM | POA: Insufficient documentation

## 2013-10-24 DIAGNOSIS — R519 Headache, unspecified: Secondary | ICD-10-CM

## 2013-10-24 DIAGNOSIS — K219 Gastro-esophageal reflux disease without esophagitis: Secondary | ICD-10-CM | POA: Insufficient documentation

## 2013-10-24 DIAGNOSIS — R0602 Shortness of breath: Secondary | ICD-10-CM | POA: Insufficient documentation

## 2013-10-24 DIAGNOSIS — Z79899 Other long term (current) drug therapy: Secondary | ICD-10-CM | POA: Insufficient documentation

## 2013-10-24 DIAGNOSIS — J310 Chronic rhinitis: Secondary | ICD-10-CM | POA: Insufficient documentation

## 2013-10-24 DIAGNOSIS — R51 Headache: Secondary | ICD-10-CM | POA: Insufficient documentation

## 2013-10-24 DIAGNOSIS — R3915 Urgency of urination: Secondary | ICD-10-CM | POA: Insufficient documentation

## 2013-10-24 DIAGNOSIS — F17200 Nicotine dependence, unspecified, uncomplicated: Secondary | ICD-10-CM | POA: Insufficient documentation

## 2013-10-24 DIAGNOSIS — Z3202 Encounter for pregnancy test, result negative: Secondary | ICD-10-CM | POA: Insufficient documentation

## 2013-10-24 LAB — BASIC METABOLIC PANEL WITH GFR
CO2: 26 meq/L (ref 19–32)
GFR calc non Af Amer: >90 mL/min (ref >90)
Glucose, Bld: 86 mg/dL (ref 70–99)
Potassium: 3.7 meq/L (ref 3.7–5.3)
Sodium: 138 meq/L (ref 137–147)

## 2013-10-24 LAB — CBC WITH DIFFERENTIAL/PLATELET
Basophils Absolute: 0 K/uL (ref 0.0–0.1)
Basophils Relative: 0 % (ref 0–1)
Eosinophils Absolute: 0 K/uL (ref 0.0–0.7)
Eosinophils Relative: 1 % (ref 0–5)
HCT: 35.9 % — ABNORMAL LOW (ref 36.0–46.0)
Hemoglobin: 12.1 g/dL (ref 12.0–15.0)
Lymphocytes Relative: 38 % (ref 12–46)
Lymphs Abs: 2.3 K/uL (ref 0.7–4.0)
MCH: 32.3 pg (ref 26.0–34.0)
MCHC: 33.7 g/dL (ref 30.0–36.0)
MCV: 95.7 fL (ref 78.0–100.0)
Monocytes Absolute: 0.5 10*3/uL (ref 0.1–1.0)
Monocytes Relative: 8 % (ref 3–12)
Neutro Abs: 3.2 10*3/uL (ref 1.7–7.7)
Neutrophils Relative %: 53 % (ref 43–77)
Platelets: 220 K/uL (ref 150–400)
RBC: 3.75 MIL/uL — ABNORMAL LOW (ref 3.87–5.11)
RDW: 11.8 % (ref 11.5–15.5)
WBC: 6.1 K/uL (ref 4.0–10.5)

## 2013-10-24 LAB — URINALYSIS, ROUTINE W REFLEX MICROSCOPIC
Bilirubin Urine: NEGATIVE
Glucose, UA: NEGATIVE mg/dL
HGB URINE DIPSTICK: NEGATIVE
Ketones, ur: NEGATIVE mg/dL
Nitrite: NEGATIVE
PROTEIN: NEGATIVE mg/dL
SPECIFIC GRAVITY, URINE: 1.022 (ref 1.005–1.030)
UROBILINOGEN UA: 0.2 mg/dL (ref 0.0–1.0)
pH: 6 (ref 5.0–8.0)

## 2013-10-24 LAB — URINE MICROSCOPIC-ADD ON

## 2013-10-24 LAB — BASIC METABOLIC PANEL
BUN: 7 mg/dL (ref 6–23)
Calcium: 9.9 mg/dL (ref 8.4–10.5)
Chloride: 100 mEq/L (ref 96–112)
Creatinine, Ser: 0.67 mg/dL (ref 0.50–1.10)
GFR calc Af Amer: >90 mL/min (ref >90)

## 2013-10-24 LAB — I-STAT TROPONIN, ED: Troponin i, poc: 0.01 ng/mL (ref 0.00–0.08)

## 2013-10-24 LAB — POC URINE PREG, ED: PREG TEST UR: NEGATIVE

## 2013-10-24 MED ORDER — MOMETASONE FUROATE 50 MCG/ACT NA SUSP: 2.0000 | Freq: Two times a day (BID) | NASAL | Status: DC

## 2013-10-24 MED ORDER — OMEPRAZOLE 40 MG PO CPDR: 40.0000 mg | DELAYED_RELEASE_CAPSULE | Freq: Every day | ORAL | Status: DC

## 2013-10-24 NOTE — ED Provider Notes (Signed)
CSN: [id number]     Arrival date & time 10/24/13  1301 History   First MD Initiated Contact with Patient 10/24/13 1648     Chief Complaint  Patient presents with  . Chest Pain     (Consider location/radiation/quality/duration/timing/severity/associated sxs/prior Treatment) Patient is a 38 y.o. female presenting with chest pain.  Chest Pain Associated symptoms: cough and headache    38 yo female presents with Chest pain x 2 weeks. Pain is intermittent and lasts about 5-7 min. Pain is associated with activity (working, cooking, walking). Pain goes away on it own. Pain described as sharp and burning in nature, rated at 10/10. Patient tried rolaids, tums, and prilosecintermittently without any relief of symptoms. Episodes associated with SOB. Patient admits to foreign body sensation in throat. Denies choking. Admits to fever/chills, nausea, belching, heartburn, and reflux. Admits to hoarse voice and dry cough in mornings that resolve throughout the day. Patient denies abdominal pain, vomiting, diarrhea, constipation. PMH significant for tubal ligation in 2009. Patient is current smoker with 7.5 pack year hx. Patient drinks alcohol occasionally (beer).   Age > 59 yo: No HR > 100 bpm: No O2 sat on RA < 95%: No Prior hx of venous thromboembolism:No Trauma or surgery in past 4 wks:No Hemoptysis:No Exogenous Estrogen use:No Unilateral Leg swelling: No Pre tests probability for PE < 15%:No      History reviewed. No pertinent past medical history. Past Surgical History  Procedure Laterality Date  . Sterilization     No family history on file. History  Substance Use Topics  . Smoking status: Current Every Day Smoker -- 0.50 packs/day    Types: Cigarettes  . Smokeless tobacco: Not on file  . Alcohol Use: No   OB History   Grav Para Term Preterm Abortions TAB SAB Ect Mult Living                 Review of Systems  Respiratory: Positive for cough.   Cardiovascular: Positive for  chest pain.  Genitourinary: Positive for urgency and frequency. Negative for dysuria, hematuria, vaginal bleeding, vaginal discharge, vaginal pain and dyspareunia.  Skin: Negative for rash.  Neurological: Positive for headaches.  All other systems reviewed and are negative.      Allergies  Naproxen  Home Medications   Current Outpatient Rx  Name  Route  Sig  Dispense  Refill  . aspirin 325 MG tablet   Oral   Take 325 mg by mouth every 4 (four) hours as needed (pain).         Marland Kitchen ibuprofen (ADVIL,MOTRIN) 200 MG tablet   Oral   Take 400 mg by mouth every 6 (six) hours as needed (pain).         . mometasone (NASONEX) 50 MCG/ACT nasal spray   Nasal   Place 2 sprays into the nose every 12 (twelve) hours.   17 g   1   . omeprazole (PRILOSEC) 40 MG capsule   Oral   Take 1 capsule (40 mg total) by mouth daily.   30 capsule   0    BP 115/71  Pulse 67  Temp(Src) 99.4 F (37.4 C) (Oral)  Resp 16  SpO2 100%  LMP 09/06/2013 Physical Exam  Nursing note and vitals reviewed. Constitutional: She is oriented to person, place, and time. She appears well-developed and well-nourished. No distress.  HENT:  Head: Normocephalic and atraumatic.  Right Ear: Tympanic membrane and ear canal normal.  Left Ear: Tympanic membrane and ear canal normal.  Nose: Mucosal edema and rhinorrhea present. Right sinus exhibits maxillary sinus tenderness and frontal sinus tenderness. Left sinus exhibits maxillary sinus tenderness and frontal sinus tenderness.  Mouth/Throat: Uvula is midline, oropharynx is clear and moist and mucous membranes are normal. No oropharyngeal exudate, posterior oropharyngeal edema or posterior oropharyngeal erythema.  Eyes: Conjunctivae and EOM are normal. Pupils are equal, round, and reactive to light. Right eye exhibits no discharge. Left eye exhibits no discharge. No scleral icterus.  Neck: Normal range of motion and phonation normal. Neck supple. No JVD present. No  rigidity. No tracheal deviation, no edema and no erythema present.  Cardiovascular: Normal rate and regular rhythm.  Exam reveals no gallop and no friction rub.   No murmur heard. Pulmonary/Chest: Effort normal and breath sounds normal. No stridor. No respiratory distress. She has no wheezes. She has no rhonchi. She has no rales. She exhibits tenderness.  Abdominal: Soft. Bowel sounds are normal. She exhibits no distension. There is no hepatosplenomegaly. There is no tenderness. There is no rigidity, no rebound, no guarding, no tenderness at McBurney's point and negative Murphy's sign.  Musculoskeletal: Normal range of motion. She exhibits no edema.  Lymphadenopathy:    She has no cervical adenopathy.  Neurological: She is alert and oriented to person, place, and time.  Skin: Skin is warm and dry. No rash noted. She is not diaphoretic.  Psychiatric: She has a normal mood and affect. Her behavior is normal.    ED Course  Procedures (including critical care time) Labs Review Labs Reviewed  CBC WITH DIFFERENTIAL - Abnormal; Notable for the following:    RBC 3.75 (*)    HCT 35.9 (*)    All other components within normal limits  URINALYSIS, ROUTINE W REFLEX MICROSCOPIC - Abnormal; Notable for the following:    APPearance CLOUDY (*)    Leukocytes, UA TRACE (*)    All other components within normal limits  URINE MICROSCOPIC-ADD ON - Abnormal; Notable for the following:    Bacteria, UA FEW (*)    All other components within normal limits  BASIC METABOLIC PANEL  I-STAT TROPOININ, ED  POC URINE PREG, ED   Imaging Review Dg Chest 2 View  10/24/2013   CLINICAL DATA:  Chest pain.  Shortness of breath.  Cough.  EXAM: CHEST  2 VIEW  COMPARISON:  None.  FINDINGS: Lung volumes are normal. No consolidative airspace disease. No pleural effusions. No pneumothorax. No pulmonary nodule or mass noted. Pulmonary vasculature and the cardiomediastinal silhouette are within normal limits.  IMPRESSION: 1.  No  radiographic evidence of acute cardiopulmonary disease.   Electronically Signed   By: Trudie Reedaniel  Entrikin M.D.   On: 10/24/2013 18:38    EKG Interpretation    Date/Time:  Monday October 24 2013 13:14:03 EST Ventricular Rate:  98 PR Interval:  136 QRS Duration: 84 QT Interval:  334 QTC Calculation: 426 R Axis:   67 Text Interpretation:  Sinus rhythm Probable left atrial enlargement Probable left ventricular hypertrophy No previous tracing Confirmed by [facility]  MD, MICHEAL ([id number]) on 10/24/2013 7:06:31 PM            MDM   Final diagnoses:  GERD (gastroesophageal reflux disease)  Sinus headache  Rhinitis   EKG shows no ischemic changes CXR negative Troponin negative UA not consistent with UTI Urine preg negative  Discussed labs, and exam findings with patient. Plan to have patient start daily PPI regimen with OTC zantac for acute symptoms. Patient started on Nasonex spray for chronic rhinitis  and recommend OTC zyrtec.  Advised follow up with GI.  Patient provided resource guide for reference. Recommend return to ED should symptoms worsen. Patient agrees with plan. Discharged in good condition.   Meds given in ED:  Medications - No data to display  Discharge Medication List as of 10/24/2013  7:16 PM    START taking these medications   Details  mometasone (NASONEX) 50 MCG/ACT nasal spray Place 2 sprays into the nose every 12 (twelve) hours., Starting 10/24/2013, Until Discontinued, Print    omeprazole (PRILOSEC) 40 MG capsule Take 1 capsule (40 mg total) by mouth daily., Starting 10/24/2013, Until Discontinued, Print         Allen Norris [place], [place] 10/25/13 [phone]

## 2013-10-24 NOTE — Discharge Instructions (Signed)
Call and make an appointment with Gastroenterology as listed above in "follow up" Section. Take Prilosec as directed for GERD symptoms. Recommend OTC Zantac for acute relief of Heartburn and reflux. Use nasal spray as directed for chronic rhinitis. Recommend OTC Zyrtec daily for chronic allergies. Nasal saline spray may be helpful as well for congestion.    Emergency Department Resource Guide 1) Find a Doctor and Pay Out of Pocket Although you won't have to find out who is covered by your insurance plan, it is a good idea to ask around and get recommendations. You will then need to call the office and see if the doctor you have chosen will accept you as a new patient and what types of options they offer for patients who are self-pay. Some doctors offer discounts or will set up payment plans for their patients who do not have insurance, but you will need to ask so you aren't surprised when you get to your appointment.  2) Contact Your Local Health Department Not all health departments have doctors that can see patients for sick visits, but many do, so it is worth a call to see if yours does. If you don't know where your local health department is, you can check in your phone book. The CDC also has a tool to help you locate your state's health department, and many state websites also have listings of all of their local health departments.  3) Find a Walk-in Clinic If your illness is not likely to be very severe or complicated, you may want to try a walk in clinic. These are popping up all over the country in pharmacies, drugstores, and shopping centers. They're usually staffed by nurse practitioners or physician assistants that have been trained to treat common illnesses and complaints. They're usually fairly quick and inexpensive. However, if you have serious medical issues or chronic medical problems, these are probably not your best option.  No Primary Care Doctor: - Call Health Connect at  [phone] -  they can help you locate a primary care doctor that  accepts your insurance, provides certain services, etc. - Physician Referral Service- [phone]  Chronic Pain Problems: Organization         Address  Phone   Notes  [facility]  [phone] Patients need to be referred by their primary care doctor.   Medication Assistance: Organization         Address  Phone   Notes  [facility] [address] [place], [place] [address] [phone] --Must be a resident of [facility] [place] -- Must have NO insurance coverage whatsoever (no Medicaid/ Medicare, etc.) -- The pt. MUST have a primary care doctor that directs their care regularly and follows them in the community   [facility]  [phone]   [employer]  [phone]    Agencies that provide inexpensive medical care: Organization         Address  Phone   Notes  [facility]  [phone]   [facility]    [phone]   [facility] [address] [place], [place] [address] [phone]   [facility] [address] [place], [place] [phone]   [facility]anned Parenthood    [phone]   [facility]    [phone]   [facility]  [address], [place] Phone:  [phone], Fax:  [phone] Hours of Operation:  9 am - 6 pm, M-F.  Also accepts Medicaid/Medicare and self-pay.  [facility] for [place], [address], [place] Phone: [phone], Fax: [phone]. Hours of Operation:  8:30 am - 5:30 pm, M-F.  Also accepts Medicaid and self-pay.  [facility] [place] [address], [place] Phone: [phone]   [place], [place], [place] [phone], Ext. [phone] Mondays & Thursdays: 7-9 AM.  First 15 patients are seen on a first come, first serve basis.    [place] Providers:  Organization         Address  Phone   Notes  [facility] [address], [place] [phone] Also accepts self-pay patients.  [facility] [address], [place]  [phone]   [place], [address], [place] [phone]   [facility] [address] [place] [phone]   Lucianne Lei [address], [place]   [phone] Only accepts [place] Access [place] patients after they have their name applied to their card.   Self-Pay (no insurance) in [facility]:  Organization         Address  Phone   Notes  Sickle Cell Patients, [facility] [place] [phone]   [facility] [place] [phone]   [facility] [place]  [place], [place], [place] [phone]   [facility]/Dr. Osei-Bonsu  [address], [place] [address], [place] [phone] Phone number for both [place] and [place] locations is the same.  [facility] [address] [place] [phone]   [facility] [address] [place] or [address] [phone]   [facility] [address], [place] [phone], phone; [phone], fax Sees patients 1st and 3rd Saturday of every month.  Must not qualify for public or private insurance (i.e. Medicaid, Medicare, [place] Health Choice, Veterans' Benefits)  Household income should be no more than 200% of the poverty level The clinic cannot treat you if you are pregnant or think you are pregnant  Sexually transmitted diseases are not treated at the clinic.    Dental Care: Organization         Address  Phone  Notes  [facility] Department of [facility] [place] [phone] Accepts children up to age 51 who are enrolled in [place] or [place]; pregnant women with a Medicaid card; and children who have applied for Medicaid or [place] Health Choice, but were declined, whose parents can pay a reduced fee at time of service.  [facility] Department of [facility]  [address], [place] [phone] Accepts children up to age 57 who are enrolled in [place] or [place]; pregnant women with a Medicaid card; and children who have applied for Medicaid or [place] Health Choice, but were declined, whose parents can pay a reduced fee at time of service.  [facility]  [place] [phone] Patients are seen by appointment only. Walk-ins are not accepted. [facility] will see patients 74 years of age and older. Monday - Tuesday (8am-5pm) Most Wednesdays (8:30-5pm) $30 per visit, cash only  [facility] Adult [facility] Access PRO[facility]  [address], [facility] [phone] Patients are seen by appointment only. Walk-ins are not accepted. [place] will see patients 52 years of age and older. One Wednesday Evening (Monthly: Volunteer Based).  $30 per visit, cash only  [place]  [phone] for adults; Children under age 91, call Graduate Pediatric Dentistry at [phone]. Children aged 57-14, please call [phone] to request a pediatric application.  Dental services are provided in all areas of dental care including fillings, crowns and bridges, complete and partial dentures, implants, gum treatment, root canals, and extractions. Preventive care is also provided. Treatment is provided to both adults and children. Patients are selected via a lottery and there is often a waiting list.   [facility] [address] [place]  [phone] [email or web address]   [facility] [address] [place], [place] [phone], Ext. [phone] Second and Fourth Thursday of each month, opens at 6:30 AM; Clinic ends at 9 AM.  Patients are seen on a first-come first-served basis, and a limited number are seen during each clinic.   [facility]  [address] [place], [place] [phone]   Eligibility Requirements You must have lived in [place], [place], or [place] counties for at least the last three months.   You cannot be eligible for state or federal sponsored [employer], including [employer], [place], or [employer].   You generally cannot be eligible for healthcare insurance through your employer.    How to apply: Eligibility screenings are held every Tuesday and Wednesday afternoon from 1:00 pm until 4:00 pm. You do not need an appointment for the interview!  [facility] [address] [place], [place]   [place]  [place] Department  [place]  [phone]    Behavioral Health Resources in the Community: Intensive Outpatient Programs Organization         Address  Phone  Notes  [facility] [place]. [address] [place], [place] [phone]   [facility] Outpatient [address], [place], [place]   ADS: [facility] Drug S[facility] [address], [place], [place]   [place] [address]  [place], [place] or [phone]   Substance Abuse Resources Organization         Address  Phone  Notes  Alcohol and Drug Services  [phone]   [place]  [phone]   [facility] [place]   Chinita Pester  [phone]   Residential & Outpatient Substance Abuse Program  [phone]   Psychological Services Organization         Address  Phone  Notes  [facility]   [place]  [phone]   [place] [address], [place] or [phone]    Mobile Crisis Teams Organization         Address  Phone  Notes  [facility], Mobile Crisis Care Unit  [phone]   [facility]  [address] [place], [place]   [facility] [address] [place] [phone]    Self-Help/Support Groups Organization  Address  Phone             Notes  °[facility] [place] - variety of support groups  336- [phone] Call for more information  °Narcotics Anonymous (NA), [facility] [address], °[place]  2 meetings at this location  ° °Residential Treatment Programs °Organization         Address  Phone  Notes  °[facility] [address],    °[place]  [phone]   °[facility] ° [address], [place], [place] [phone]   °[facility] [address], [place] [phone] Admissions: 8am-3pm M-F  °[facility] [address]    °[place], [place] [phone]   °[facility] [address], [place], [place] [phone]   °[facility] [address]  °[place], [place] [phone]   °[facility] - Intensive Outpatient [address], [place], [place] [phone]   °[facility] [address]  °[place], [place] 1-877-615-2722 or [phone]   °[facility] ([facility]) [address] [place], [place] [phone] Accepts Medicaid  °[facility] [address]  °[place] 1-800-659-3381 Substance Abuse/Addiction Treatment  ° °[place] Behavioral Health Resources °Organization         Address  Phone  Notes  °[facility]  (888) [phone]   °Julie Brannon, PhD [address] [place], [place]   (336) [phone] or  (336) 951-0000   ° Behavioral   [address] °[place], [place] ([phone]) [phone]   °[facility] [address], [place], [place] (336) [phone] Insurance/Medicaid/sponsorship through [facility]  °[facility] [address]                                    [place], [place] ([phone]) [phone] Therapy/tele-psych/case  °[facility] [address]  ° [place], [place] (336) [phone]    °Dr. Arfeen  ([phone]) [phone]   °[facility] [place]  [employer] [place] [facility] 1) [address],  °2) [address], [place] °3)  [address], [place] ([phone]) [phone] °(336) [phone] ° °(336) [phone]   °[place] Child Abuse Hotline (336) 342-1394 or (336) 342-3537 (After Hours)    ° ° ° °

## 2013-10-24 NOTE — Progress Notes (Signed)
[facility] provided pt with a list of primary care resources and GCCN orange card application to help patient establish primary care.

## 2013-10-24 NOTE — ED Notes (Signed)
Pt c/o chest pain and migraines x 2 wks; states chest pain feels like fullness; not relieved with otc acid reflux meds; feels hard to breathe at times

## 2013-10-28 NOTE — ED Provider Notes (Signed)
Medical screening examination/treatment/procedure(s) were performed by non-physician practitioner and as supervising physician I was immediately available for consultation/collaboration.  Zeyna Mkrtchyan Y. Zakiyyah [place], MD 10/28/13 1048 

## 2017-03-20 ENCOUNTER — Emergency Department (HOSPITAL_BASED_OUTPATIENT_CLINIC_OR_DEPARTMENT_OTHER)
Admission: EM | Admit: 2017-03-20 | Discharge: 2017-03-20 | Disposition: A | Discharge status: Home / Self Care | Payer: Medicaid Other | Attending: Emergency Medicine | Admitting: Emergency Medicine

## 2017-03-20 ENCOUNTER — Encounter (HOSPITAL_BASED_OUTPATIENT_CLINIC_OR_DEPARTMENT_OTHER): Payer: Self-pay

## 2017-03-20 DIAGNOSIS — R202 Paresthesia of skin: Secondary | ICD-10-CM | POA: Diagnosis present

## 2017-03-20 DIAGNOSIS — Z79899 Other long term (current) drug therapy: Secondary | ICD-10-CM | POA: Insufficient documentation

## 2017-03-20 DIAGNOSIS — R519 Headache, unspecified: Secondary | ICD-10-CM

## 2017-03-20 DIAGNOSIS — Z91040 Latex allergy status: Secondary | ICD-10-CM | POA: Diagnosis not present

## 2017-03-20 DIAGNOSIS — F17210 Nicotine dependence, cigarettes, uncomplicated: Secondary | ICD-10-CM | POA: Diagnosis not present

## 2017-03-20 DIAGNOSIS — R51 Headache: Secondary | ICD-10-CM | POA: Diagnosis not present

## 2017-03-20 HISTORY — DX: Migraine, unspecified, not intractable, without status migrainosus: G43.909

## 2017-03-20 MED ORDER — PROMETHAZINE HCL 25 MG/ML IJ SOLN
12.5000 mg | Freq: Once | INTRAMUSCULAR | Status: AC
  Administered 2017-03-20: 12.5 mg via INTRAVENOUS
  Filled 2017-03-20: qty 1

## 2017-03-20 MED ORDER — KETOROLAC TROMETHAMINE 30 MG/ML IJ SOLN
30.0000 mg | Freq: Once | INTRAMUSCULAR | Status: AC
  Administered 2017-03-20: 30 mg via INTRAVENOUS
  Filled 2017-03-20: qty 1

## 2017-03-20 MED ORDER — DIPHENHYDRAMINE HCL 50 MG/ML IJ SOLN
12.5000 mg | Freq: Once | INTRAMUSCULAR | Status: AC
  Administered 2017-03-20: 12.5 mg via INTRAVENOUS
  Filled 2017-03-20: qty 1

## 2017-03-20 NOTE — Discharge Instructions (Signed)
Please keep your follow-up appointment with your neurologist. If you develop new or worsening symptoms, including changes to your vision, numbness or weakness, or fever, please return to the emergency department for reevaluation. Chantix may make her headache and other symptoms worse. You may consider stopping this medication to see if symptoms improve.

## 2017-03-20 NOTE — ED Provider Notes (Signed)
[facility] Provider Note   CSN: [id number] Arrival date & time: 03/20/17  1737  By signing my name below, I, Vista Mink, attest that this documentation has been prepared under the direction and in the presence of Kaylub Detienne PA-C  Electronically Signed: Vista Mink, ED Scribe. 03/20/17. 7:00 PM.   History   Chief Complaint Chief Complaint  Patient presents with  . Tingling    HPI HPI Comments: Melanie Nguyen is a 41 y.o. female who presents to the Emergency Department complaining of acute on chronic migraines which has been worsening for the past 23 days. Pt has had chronic migraines since 2006. Within the past 23 days, her migraines have been recurring every day. Normally, she does not have these migraines every day. Recently she states the pain has been so severe that she becomes lightheaded and feels like she's going to pass out. Pt further notes that her sleep has been disturbed from these symptoms. She states that she will frequently wake up in the middle of the night with pain in her left ear and behind her left eye. Pt also reports a sharp pain running down the left side of her head and down her neck during these instances. She was seen by her PCP on 03/03/17 for these symptoms and was given Rx for Topamax which she has not taken. Pt was also given a Chantix to help her quit smoking. She was referred to a Neurologist and states that she was unable to get an appointment until September. She also notes a visual disturbance in her left eye which is also new within the past month. Pt describes this as frequent "black dots" in her left eye. Pt also notes decreased hearing out of her left ear, no change on the right. She is currently experiencing a migraine which she rates as a 6/10 in severity. She also notes that she has had light vaginal bleeding since 6/28 of last month, this has been a problem for her in the past. No vomiting, fever.   PCP: Dr. Erenest Rasher   The history is  provided by the patient. No language interpreter was used.    Past Medical History:  Diagnosis Date  . Migraine     Patient Active Problem List   Diagnosis Date Noted  . ANXIETY DEPRESSION 03/28/2009  . INSOMNIA 03/28/2009  . IRON DEFICIENCY ANEMIA, HX OF 07/10/2008  . 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 TOBACCO DEPENDENCE 10/29/2006  . DEPRESSIVE DISORDER, NOS 10/29/2006  . IRRITABLE BOWEL SYNDROME 10/29/2006  . ACNE 10/29/2006    Past Surgical History:  Procedure Laterality Date  . STERILIZATION    . TUBAL LIGATION      OB History    No data available       Home Medications    Prior to Admission medications   Medication Sig Start Date End Date Taking? Authorizing Provider  cholecalciferol (VITAMIN D) 1000 units tablet Take 1,000 Units by mouth daily.   Yes [provider]  ClonazePAM (KLONOPIN PO) Take by mouth.   Yes [provider]  Sulfamethoxazole-Trimethoprim (BACTRIM PO) Take by mouth.   Yes [provider]  Topiramate (TOPAMAX PO) Take by mouth.   Yes [provider]    Family History No family history on file.  Social History Social History  Substance Use Topics  . Smoking status: Current Every Day Smoker    Packs/day: 0.50    Types: Cigarettes  . Smokeless tobacco: Never Used  . Alcohol use  No     Allergies   Latex and Naproxen   Review of Systems Review of Systems  Constitutional: Negative for fever.  HENT: Positive for hearing loss (L ear).   Eyes: Positive for visual disturbance (chronic, black spots in left eye).  Gastrointestinal: Negative for vomiting.  Genitourinary: Positive for vaginal bleeding.  Neurological: Positive for headaches. Negative for syncope.   Physical Exam Updated Vital Signs BP 114/79 (BP Location: Left Arm)   Pulse 97   Temp 99 F (37.2 C) (Oral)   Resp 18   Ht 5\' 4"  (1.626 m)   Wt 68.5 kg (151 lb)   LMP 02/26/2017   SpO2 99%   BMI 25.92 kg/m    Physical Exam  Constitutional: No distress.  HENT:  Head: Normocephalic.  Eyes: Pupils are equal, round, and reactive to light. Conjunctivae and EOM are normal.  Neck: Normal range of motion. Neck supple.  Cardiovascular: Normal rate, regular rhythm and normal heart sounds.  Exam reveals no gallop and no friction rub.   No murmur heard. Pulmonary/Chest: Effort normal. No respiratory distress. She has no wheezes. She has no rales.  Abdominal: Soft. She exhibits no distension. There is no tenderness. There is no rebound and no guarding.  Neurological: She is alert.  Cranial nerves 2-12 intact. Finger-to-nose is normal. 5/5 motor strength of the bilateral upper and lower extremities. Moves all four extremities. Negative Romberg. Ambulatory without difficulty. NVI.    Skin: Skin is warm. No rash noted.  Psychiatric: Her behavior is normal.  Nursing note and vitals reviewed.  ED Treatments / Results  DIAGNOSTIC STUDIES: Oxygen Saturation is 99% on RA, normal by my interpretation.  COORDINATION OF CARE: 6:24 PM-Discussed treatment plan with pt at bedside and pt agreed to plan.   Labs (all labs ordered are listed, but only abnormal results are displayed) Labs Reviewed - No data to display  EKG  EKG Interpretation None       Radiology No results found.  Procedures Procedures (including critical care time)  Medications Ordered in ED Medications  ketorolac (TORADOL) 30 MG/ML injection 30 mg (30 mg Intravenous Given 03/20/17 1904)  diphenhydrAMINE (BENADRYL) injection 12.5 mg (12.5 mg Intravenous Given 03/20/17 1904)  promethazine (PHENERGAN) injection 12.5 mg (12.5 mg Intravenous Given 03/20/17 1904)     Initial Impression / Assessment and Plan / ED Course  I have reviewed the triage vital signs and the nursing notes.  Pertinent labs & imaging results that were available during my care of the patient were reviewed by me and considered in my medical decision making (see  chart for details).     Pt HA treated and improved while in ED. Presentation is like pts typical, chronic HA and non concerning for [facility], ICH, Meningitis, or temporal arteritis. Pt is afebrile with no focal neuro deficits, nuchal rigidity, or change in vision. Encourage the patient to discontinue Chantix, which may be contributing to more frequent recurrence of HAs. Pt is to keep her appointment with neurology to discuss prophylactic medication. Pt verbalizes understanding and is agreeable with plan. No acute distress. Vital signs stable. The patient is safe for discharge at this time.  Final Clinical Impressions(s) / ED Diagnoses   Final diagnoses:  Acute nonintractable headache, unspecified headache type    New Prescriptions Discharge Medication List as of 03/20/2017  8:02 PM    I personally performed the services described in this documentation, which was scribed in my presence. The recorded information has been reviewed and is accurate.  Frederik PearMcDonald, Undra Harriman A, PA-C 03/21/17 Gretel [id number]    Jerelyn ScottLinker, Martha, MD 03/21/17 1610

## 2017-03-20 NOTE — ED Triage Notes (Addendum)
C/o tingling to left side of face "for years"-"spots to my left eye" and earache x 1 month-pt states she was seen by PCP 3 days ago and is awaiting neuro appt-NAD-steady gait

## 2017-03-25 ENCOUNTER — Ambulatory Visit: Payer: Medicaid Other | Admitting: Neurology

## 2017-03-30 ENCOUNTER — Encounter: Payer: Self-pay | Admitting: Neurology

## 2018-02-24 ENCOUNTER — Encounter

## 2018-09-20 ENCOUNTER — Other Ambulatory Visit: Payer: Self-pay | Admitting: Internal Medicine

## 2018-09-20 DIAGNOSIS — R5381 Other malaise: Secondary | ICD-10-CM

## 2018-09-22 ENCOUNTER — Other Ambulatory Visit: Payer: Self-pay | Admitting: Physician Assistant

## 2018-09-22 ENCOUNTER — Other Ambulatory Visit: Payer: Self-pay | Admitting: Internal Medicine

## 2018-09-22 DIAGNOSIS — R1011 Right upper quadrant pain: Secondary | ICD-10-CM

## 2018-09-22 DIAGNOSIS — R110 Nausea: Secondary | ICD-10-CM

## 2018-09-29 ENCOUNTER — Ambulatory Visit
Admission: RE | Admit: 2018-09-29 | Discharge: 2018-09-29 | Disposition: A | Discharge status: Home / Self Care | Payer: Medicaid Other | Source: Ambulatory Visit | Attending: Physician Assistant | Admitting: Physician Assistant

## 2018-09-29 DIAGNOSIS — R110 Nausea: Secondary | ICD-10-CM

## 2018-09-29 DIAGNOSIS — R1011 Right upper quadrant pain: Secondary | ICD-10-CM

## 2018-10-01 ENCOUNTER — Other Ambulatory Visit: Payer: Self-pay

## 2018-10-01 DIAGNOSIS — I83893 Varicose veins of bilateral lower extremities with other complications: Secondary | ICD-10-CM

## 2018-10-26 ENCOUNTER — Ambulatory Visit (INDEPENDENT_AMBULATORY_CARE_PROVIDER_SITE_OTHER): Payer: Medicaid Other | Admitting: Vascular Surgery

## 2018-10-26 ENCOUNTER — Ambulatory Visit (HOSPITAL_COMMUNITY)
Admission: RE | Admit: 2018-10-26 | Discharge: 2018-10-26 | Disposition: A | Discharge status: Home / Self Care | Payer: Medicaid Other | Source: Ambulatory Visit | Attending: Vascular Surgery | Admitting: Vascular Surgery

## 2018-10-26 ENCOUNTER — Encounter: Payer: Self-pay | Admitting: Vascular Surgery

## 2018-10-26 VITALS — BP 126/82 | HR 95 | Ht 64.0 in | Wt 173.2 lb

## 2018-10-26 DIAGNOSIS — G609 Hereditary and idiopathic neuropathy, unspecified: Secondary | ICD-10-CM | POA: Diagnosis not present

## 2018-10-26 DIAGNOSIS — I83893 Varicose veins of bilateral lower extremities with other complications: Secondary | ICD-10-CM | POA: Insufficient documentation

## 2018-10-26 NOTE — Progress Notes (Signed)
[facility] [place]  Patient name: Melanie Nguyen MRN: [id number] DOB: 1976-07-30 Sex: female  REASON FOR CONSULT: Evaluation bilateral lower extremity symptoms.  Rule out arterial and venous pathology  HPI: Melanie Nguyen is a 43 y.o. female, who is today for discussion of lower extremity discomfort.  She is an otherwise healthy 43 year old who reports discomfort in both lower extremities.  This is been present for quite some time.  She reports that on a scale from 1-10 that it is typically a 5 in her right calf and an 8 in her left calf.  It is worse with prolonged standing.  She reports that this can vary from sharp to burning to achy sensation.  This can occur in her whole lower extremities but is worse from her knees distally and includes her feet.  She does have mild swelling occasionally but this is not a major issue for her.  She has no lower extremity tissue loss.  She is not diabetic.  Past Medical History:  Diagnosis Date  . Migraine     History reviewed. No pertinent family history.  SOCIAL HISTORY: Social History   Socioeconomic History  . Marital status: Divorced    Spouse name: Not on file  . Number of children: Not on file  . Years of education: Not on file  . Highest education level: Not on file  Occupational History  . Not on file  Social Needs  . Financial resource strain: Not on file  . Food insecurity:    Worry: Not on file    Inability: Not on file  . Transportation needs:    Medical: Not on file    Non-medical: Not on file  Tobacco Use  . Smoking status: Current Every Day Smoker    Packs/day: 0.50    Types: Cigarettes  . Smokeless tobacco: Never Used  Substance and Sexual Activity  . Alcohol use: No  . Drug use: No  . Sexual activity: Not on file  Lifestyle  . Physical activity:    Days per week: Not on file    Minutes per session: Not on file  . Stress: Not on file  Relationships  .  Social connections:    Talks on phone: Not on file    Gets together: Not on file    Attends religious service: Not on file    Active member of club or organization: Not on file    Attends meetings of clubs or organizations: Not on file    Relationship status: Not on file  . Intimate partner violence:    Fear of current or ex partner: Not on file    Emotionally abused: Not on file    Physically abused: Not on file    Forced sexual activity: Not on file  Other Topics Concern  . Not on file  Social History Narrative  . Not on file    Allergies  Allergen Reactions  . Latex   . Naproxen     REACTION: Hives    Current Outpatient Medications  Medication Sig Dispense Refill  . clonazePAM (KLONOPIN) 0.5 MG tablet Take 0.5 mg by mouth 2 (two) times daily as needed.    . topiramate (TOPAMAX) 25 MG tablet Take 25 mg by mouth 2 (two) times daily.    . cholecalciferol (VITAMIN D) 1000 units tablet Take 1,000 Units by mouth daily.     No current facility-administered medications for this visit.     REVIEW OF SYSTEMS:  [X]   denotes positive finding, [ ]  denotes negative finding Cardiac  Comments:  Chest pain or chest pressure:    Shortness of breath upon exertion:    Short of breath when lying flat:    Irregular heart rhythm:        Vascular    Pain in calf, thigh, or hip brought on by ambulation: x   Pain in feet at night that wakes you up from your sleep:  x   Blood clot in your veins:    Leg swelling:         Pulmonary    Oxygen at home:    Productive cough:     Wheezing:         Neurologic    Sudden weakness in arms or legs:     Sudden numbness in arms or legs:     Sudden onset of difficulty speaking or slurred speech:    Temporary loss of vision in one eye:     Problems with dizziness:         Gastrointestinal    Blood in stool:     Vomited blood:         Genitourinary    Burning when urinating:     Blood in urine:        Psychiatric    Major depression:          Hematologic    Bleeding problems:    Problems with blood clotting too easily:        Skin    Rashes or ulcers:        Constitutional    Fever or chills:      PHYSICAL EXAM: Vitals:   10/26/18 1443  BP: 126/82  Pulse: 95  SpO2: 98%  Weight: 173 lb 2.7 oz (78.5 kg)  Height: 5\' 4"  (1.626 m)    GENERAL: The patient is a well-nourished female, in no acute distress. The vital signs are documented above. CARDIOVASCULAR: 2+ radial and 2+ dorsalis pedis pulses bilaterally.  No evidence of venous varicosities or telangiectasia on either lower extremity PULMONARY: There is good air exchange  ABDOMEN: Soft and non-tender  MUSCULOSKELETAL: There are no major deformities or cyanosis. NEUROLOGIC: No focal weakness or paresthesias are detected. SKIN: There are no ulcers or rashes noted. PSYCHIATRIC: The patient has a normal affect.  DATA:  Lower extremity noninvasive venous studies revealed no evidence of DVT.  She has mild reflux at the left saphenofemoral junction and no dilatation of her saphenous vein  MEDICAL ISSUES: I discussed these findings with the patient.  I do not see any evidence of lower extremity arterial or venous pathology to explain her symptoms.  She is having some improvement with gabapentin.  I do feel that this is most consistent with peripheral neuropathy.  She also reports some improvement in discomfort with compression and I have encouraged her to continue this as well.  She will see [place] again on an as-needed basis   Larina Earthly, MD [facility] Tel [phone] Pager [phone]

## 2019-03-09 ENCOUNTER — Ambulatory Visit: Payer: Medicaid Other | Admitting: Neurology

## 2019-03-09 ENCOUNTER — Telehealth: Payer: Self-pay

## 2019-03-09 ENCOUNTER — Other Ambulatory Visit: Payer: Self-pay

## 2019-03-09 ENCOUNTER — Encounter: Payer: Self-pay | Admitting: Neurology

## 2019-03-09 ENCOUNTER — Telehealth: Payer: Self-pay | Admitting: Neurology

## 2019-03-09 VITALS — BP 125/83 | HR 93 | Ht 64.0 in | Wt 173.0 lb

## 2019-03-09 DIAGNOSIS — G4489 Other headache syndrome: Secondary | ICD-10-CM

## 2019-03-09 DIAGNOSIS — R519 Headache, unspecified: Secondary | ICD-10-CM

## 2019-03-09 DIAGNOSIS — G43019 Migraine without aura, intractable, without status migrainosus: Secondary | ICD-10-CM | POA: Diagnosis not present

## 2019-03-09 DIAGNOSIS — Z789 Other specified health status: Secondary | ICD-10-CM | POA: Diagnosis not present

## 2019-03-09 DIAGNOSIS — E663 Overweight: Secondary | ICD-10-CM

## 2019-03-09 DIAGNOSIS — R51 Headache: Secondary | ICD-10-CM

## 2019-03-09 DIAGNOSIS — G479 Sleep disorder, unspecified: Secondary | ICD-10-CM | POA: Diagnosis not present

## 2019-03-09 MED ORDER — AJOVY 225 MG/1.5ML ~~LOC~~ SOAJ: 225.0000 mg | SUBCUTANEOUS | 5 refills | Status: AC

## 2019-03-09 MED ORDER — UBRELVY 50 MG PO TABS: 50.0000 mg | ORAL_TABLET | ORAL | 3 refills | Status: AC | PRN

## 2019-03-09 NOTE — Telephone Encounter (Signed)
PA for ajovy completed via [employer]. Should have a determination in 24 hours.

## 2019-03-09 NOTE — Telephone Encounter (Signed)
I called [employer], completed PA for ubrelvy. Ref # [id number]. Should have a determination in 24 hours.
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
## 2019-03-09 NOTE — Telephone Encounter (Signed)
medicaid order sent to GI. They will obtain the auth and reach out to the patient to schedule.  °

## 2019-03-09 NOTE — Patient Instructions (Signed)
You can Continue with your current medications for now.  For acute treatment for migraines, start Ubrelvy, 50 mg strength: Take 1 pill at onset of migraine headache, may repeat in 2 hours, no more than 4 pills in 24 hours, i.e. not to exceed 200 mg per 24 hours. May cause sedation and nausea.   You have tried treatments for your migraines.  You are a good candidate for one of the newer injectable medications for migraine prevention, such as Aimovig, Ajovy and Emgality.  As discussed, we will start ajovy 1 inj subcutaneously every 30 days.  We can help you with your first injection and education, if needed.  Potential side effects include (but are not limited to): Constipation, Antibody development, Injection site reaction, muscle cramps or muscle spasm, itching, redness, pain at the injection site and very rarely: Anaphylaxis, angioedema (severe swelling including around mouth and tongue), hypersensitivity reaction (rare).   We will do a brain scan, called MRI and call you with the test results. We will have to schedule you for this on a separate date. This test requires authorization from your insurance, and we will take care of the insurance process. We will also do a sleep study to rule out obstructive sleep apnea as a contributor to increase in headaches.  Sleep apnea can cause morning headaches.  Please consider CPAP or AutoPap therapy if you have sleep apnea, we will call you with your sleep study results as well.  This will need insurance authorization as well. Please try to you reduce your caffeine intake as excess caffeine can perpetuate headaches and caffeine withdrawal can also cause headaches.  Please try to hydrate well with water.  Please try to quit smoking.

## 2019-03-10 ENCOUNTER — Telehealth: Payer: Self-pay

## 2019-03-10 LAB — COMPREHENSIVE METABOLIC PANEL
ALT: 14 IU/L (ref 0–32)
AST: 17 IU/L (ref 0–40)
Albumin/Globulin Ratio: 1.3 (ref 1.2–2.2)
Albumin: 4.4 g/dL (ref 3.8–4.8)
Alkaline Phosphatase: 85 IU/L (ref 39–117)
BUN/Creatinine Ratio: 8 — ABNORMAL LOW (ref 9–23)
BUN: 6 mg/dL (ref 6–24)
Bilirubin Total: 0.3 mg/dL (ref 0.0–1.2)
CO2: 24 mmol/L (ref 20–29)
Calcium: 10.2 mg/dL (ref 8.7–10.2)
Chloride: 100 mmol/L (ref 96–106)
Creatinine, Ser: 0.78 mg/dL (ref 0.57–1.00)
GFR calc Af Amer: 109 mL/min/{1.73_m2} (ref >59)
GFR calc non Af Amer: 95 mL/min/{1.73_m2} (ref >59)
Globulin, Total: 3.4 g/dL (ref 1.5–4.5)
Glucose: 83 mg/dL (ref 65–99)
Potassium: 4.7 mmol/L (ref 3.5–5.2)
Sodium: 143 mmol/L (ref 134–144)
Total Protein: 7.8 g/dL (ref 6.0–8.5)

## 2019-03-10 NOTE — Progress Notes (Signed)
CMP looks good, please update pt. Melanie Nguyen

## 2019-03-10 NOTE — Telephone Encounter (Signed)
I called pt to discuss her lab results. No answer, left a message asking her to call me back. 

## 2019-03-10 NOTE — Telephone Encounter (Signed)
-----   Message from Star Age, MD sent at 03/10/2019  7:57 AM EDT ----- CMP looks good, please update pt. Melanie Nguyen

## 2019-03-11 NOTE — Telephone Encounter (Signed)
Pt returned call. She was informed that the office is closed on Friday's until further notice and that they will get the message on Monday that she called back.

## 2019-03-14 NOTE — Telephone Encounter (Signed)
Received order last Thursday, 7/9 but was out of the office. Will call patient as soon as possible.

## 2019-03-14 NOTE — Telephone Encounter (Signed)
Pt returned my call and I advised her of the lab results and of the denial of coverage for the recommended medications. Pt is unsure when she will start her new insurance. I advised her to let us know when that will be. Otherwise, I will let Dr. Rexene Alberts know of the denials. Pt verbalized understanding.

## 2019-03-14 NOTE — Telephone Encounter (Signed)
PA for Melanie Nguyen was denied by Medicaid.

## 2019-03-14 NOTE — Telephone Encounter (Signed)
We will see how things go, she recently had an increase in her Topamax from 25 mg twice daily to 50 mg twice daily, this can help as well.  I also ordered a sleep study, if she has underlying sleep apnea, treatment of her sleep apnea may also help her HAs. No further action required, as you have already talked to her. I see where she is scheduled for her brain MRI but I do not see her scheduled for a sleep study as yet. Please check with the sleep lab.

## 2019-03-14 NOTE — Telephone Encounter (Signed)
I called pt again to discuss. No answer, left a message asking her to call me back.  Pt's Roselyn Meier and ajovy coverage were denied by Medicaid.

## 2019-03-14 NOTE — Telephone Encounter (Signed)
Medicaid denied ajovy coverage.

## 2019-03-29 ENCOUNTER — Ambulatory Visit
Admission: RE | Admit: 2019-03-29 | Discharge: 2019-03-29 | Disposition: A | Discharge status: Home / Self Care | Payer: Medicaid Other | Source: Ambulatory Visit | Attending: Neurology | Admitting: Neurology

## 2019-03-29 DIAGNOSIS — G4489 Other headache syndrome: Secondary | ICD-10-CM

## 2019-03-29 MED ORDER — GADOBENATE DIMEGLUMINE 529 MG/ML IV SOLN
15.0000 mL | Freq: Once | INTRAVENOUS | Status: AC | PRN
  Administered 2019-03-29: 15 mL via INTRAVENOUS

## 2019-03-30 NOTE — Progress Notes (Signed)
Please update patient that her brain MRI with and without contrast has been reported as normal. We will proceed with sleep study testing as discussed and scheduled for early August and follow-up afterwards.  We will also call her after the sleep study results are available. [name]

## 2019-03-31 ENCOUNTER — Telehealth: Payer: Self-pay

## 2019-03-31 NOTE — Telephone Encounter (Signed)
-----   Message from Star Age, MD sent at 03/30/2019  5:48 PM EDT ----- Please update patient that her brain MRI with and without contrast has been reported as normal. We will proceed with sleep study testing as discussed and scheduled for early August and follow-up afterwards.  We will also call her after the sleep study results are available. [name]

## 2019-03-31 NOTE — Telephone Encounter (Signed)
I called pt to discuss her MRI results. No answer, left a message asking her to call me back. 

## 2019-04-05 NOTE — Telephone Encounter (Signed)
Cover my meds called in and stated they faxed over a appeal form and wants to know what needs to be done going forward  CB# [id number]  Ref # [id number]

## 2019-04-05 NOTE — Telephone Encounter (Signed)
I called pt again to discuss. No answer, left a message asking her to call me back. 

## 2019-04-05 NOTE — Telephone Encounter (Signed)
Pt will be changing insurances and will let us know when this happens. No appeal is needed at this time.

## 2019-04-07 NOTE — Telephone Encounter (Signed)
I called pt. I advised her that her MRI results are normal. Pt reports that she had to cancel her sleep study but does want to reschedule it. I will ask our sleep lab to assist with this. Pt verbalized understanding of results and recommendations. Pt had no questions at this time but was encouraged to call back if questions arise.

## 2019-04-11 NOTE — Telephone Encounter (Signed)
Pt was a No show for sleep study appt. I also called pt next day to reschedule with no answer and no return call back. Will try again today to contact patient.

## 2019-04-12 NOTE — Telephone Encounter (Signed)
I have called patient again to reschedule her sleep study appt. No answer, left message asking her to call me back.

## 2019-06-09 ENCOUNTER — Ambulatory Visit: Payer: Medicaid Other | Admitting: Neurology

## 2019-06-09 ENCOUNTER — Telehealth: Payer: Self-pay

## 2019-06-09 NOTE — Telephone Encounter (Signed)
Pt did not show for their appt with Dr. Athar today.  

## 2019-06-14 ENCOUNTER — Telehealth: Payer: Self-pay | Admitting: *Deleted

## 2019-06-14 NOTE — Telephone Encounter (Signed)
Per [facility] policy and established pt may no-show three appointments before they are dismissed.

## 2019-06-14 NOTE — Telephone Encounter (Signed)
Patient has 2 No Shows. One on 04/03/2019 for a Sleep Study and the second one on 06/09/2019 for a Follow Up.  Will patient be dismissed?  Or should we send a No Show Letter?

## 2019-09-28 IMAGING — US US ABDOMEN LIMITED
1 series · 14 of 25 positions shown · non-contrast
Comparison: None.

CLINICAL DATA: Right upper quadrant pain for 1 year

EXAM:
ULTRASOUND ABDOMEN LIMITED RIGHT UPPER QUADRANT

[Series 1: us abdomen limited · 0.22mm/px · 14 of 37 slices shown]
[im 1/37]
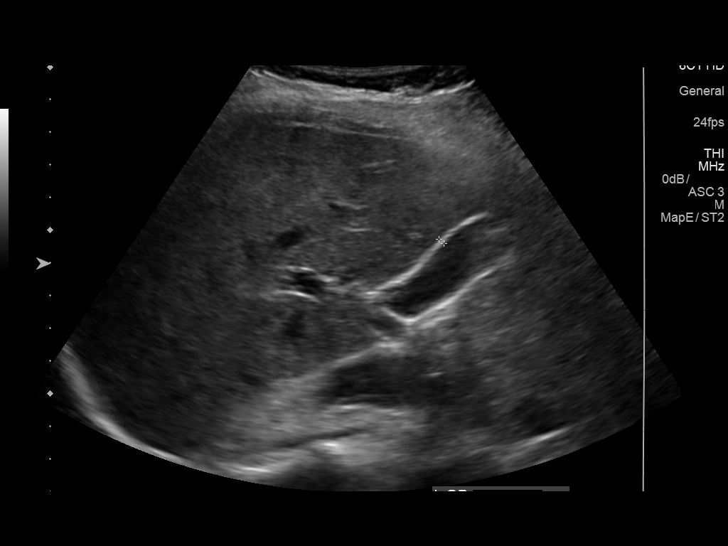
[im 4/37]
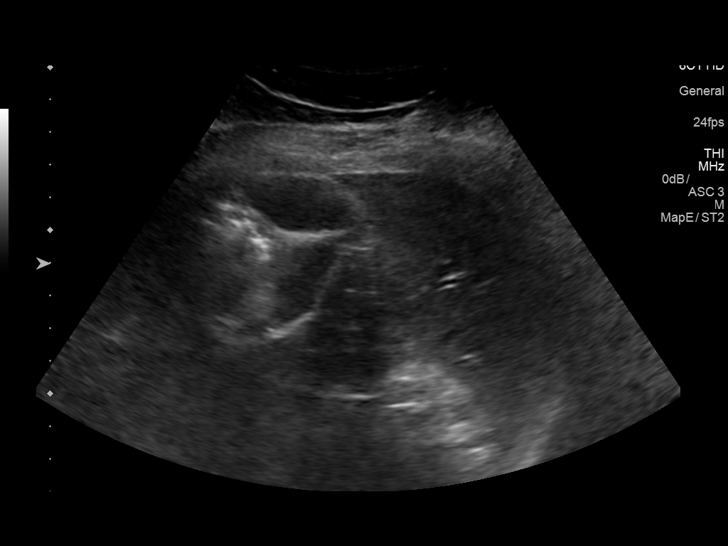
[im 7/37]
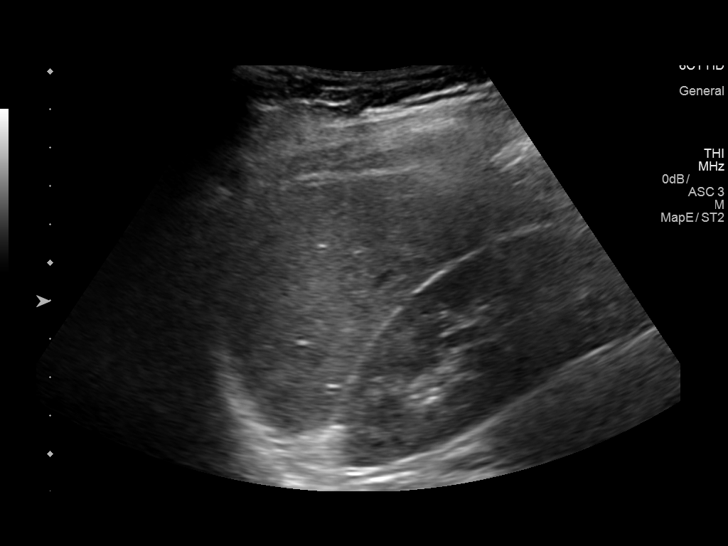
[im 10/37]
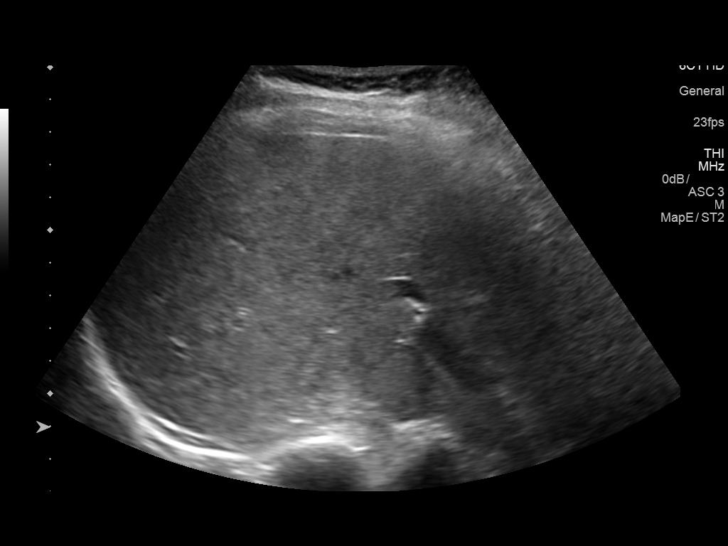
[im 13/37]
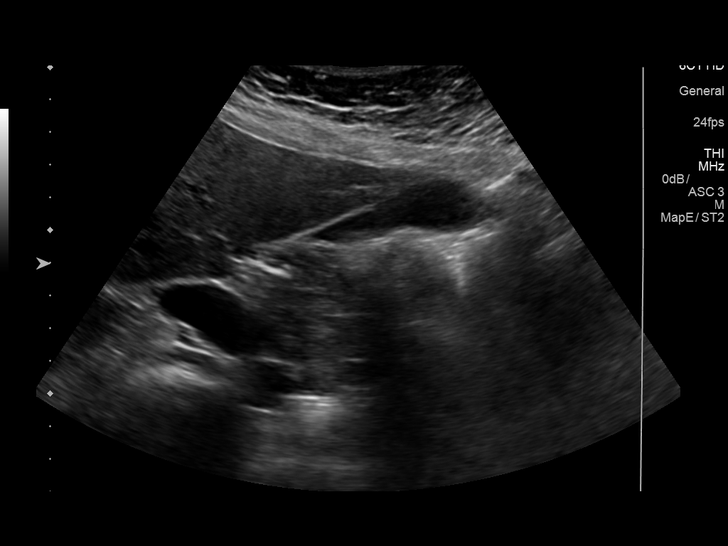
[im 14/37]
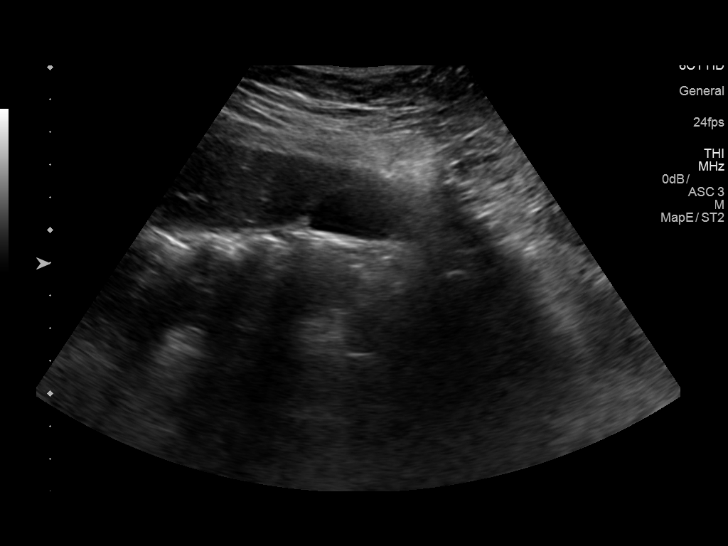
[im 17/37]
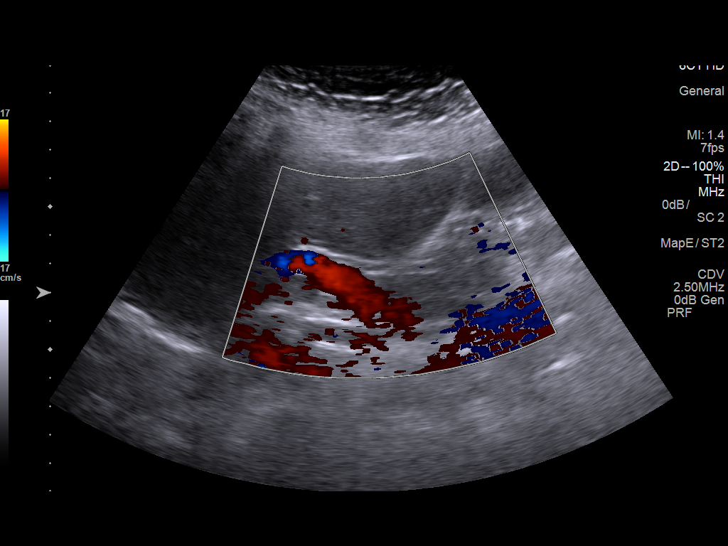
[im 20/37]
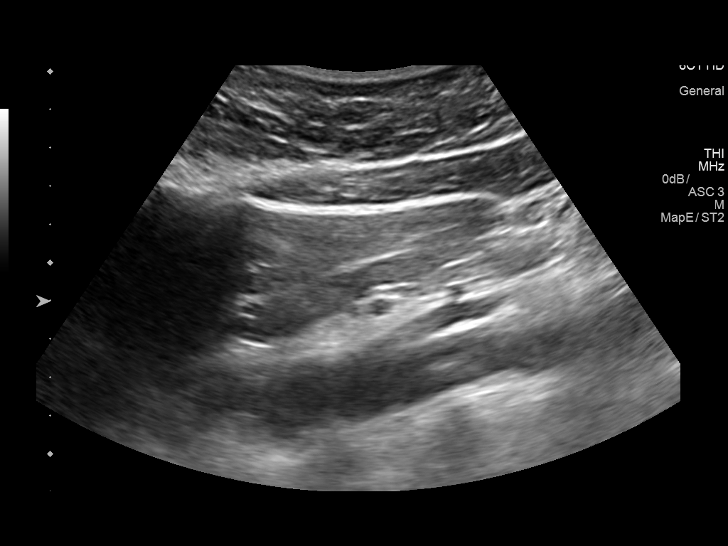
[im 23/37]
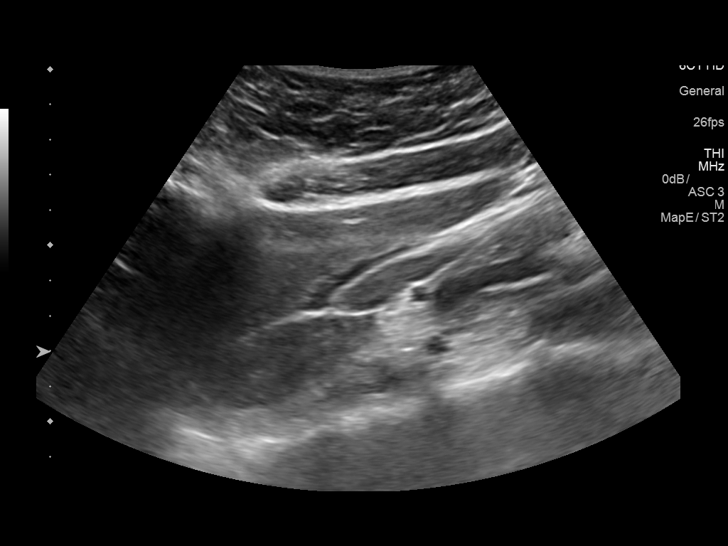
[im 25/37]
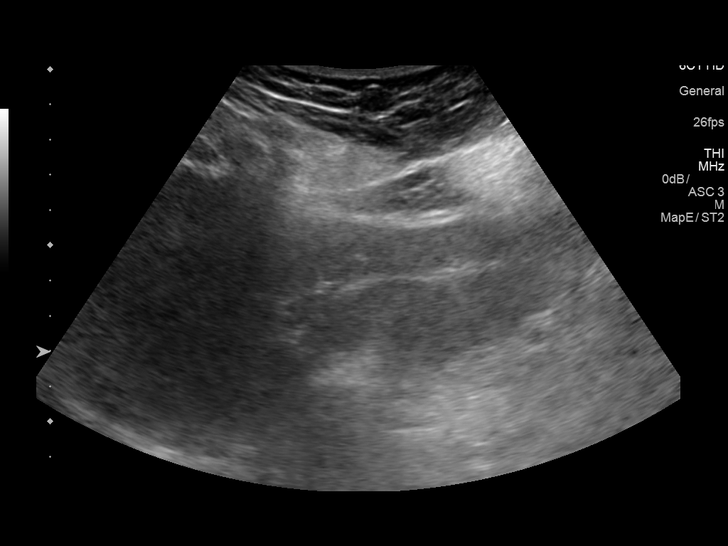
[im 28/37]
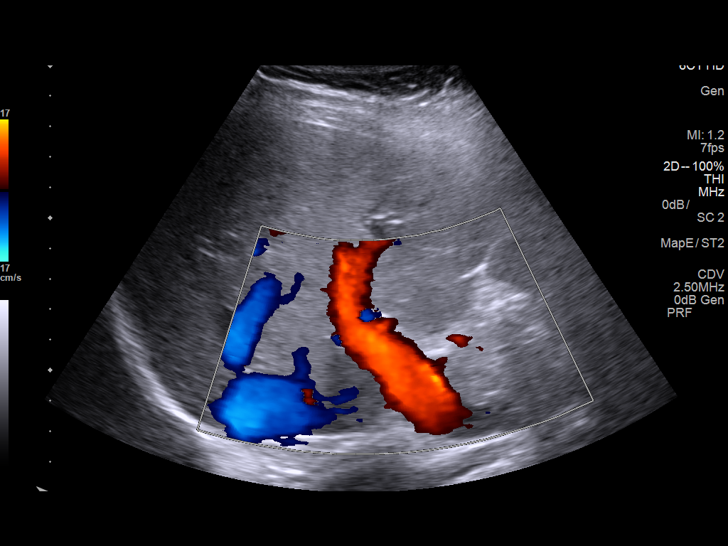
[im 31/37]
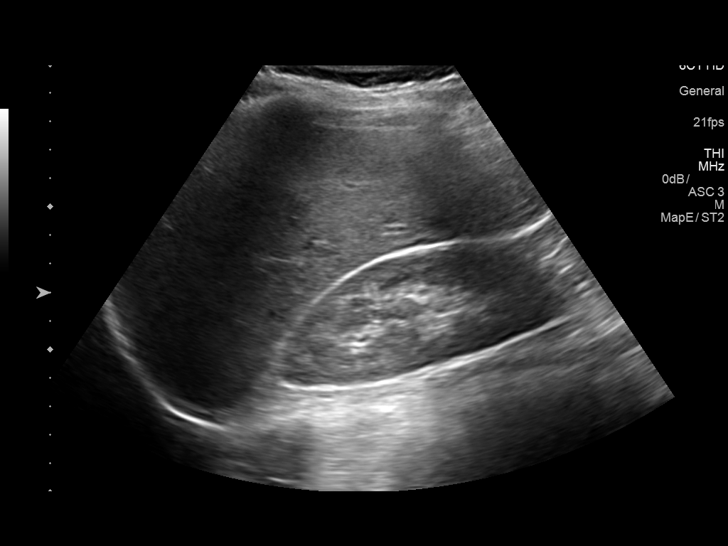
[im 34/37]
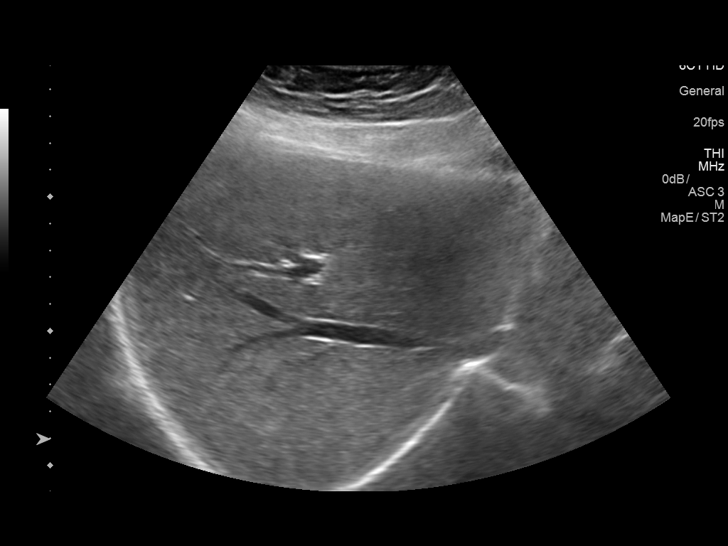
[im 37/37]
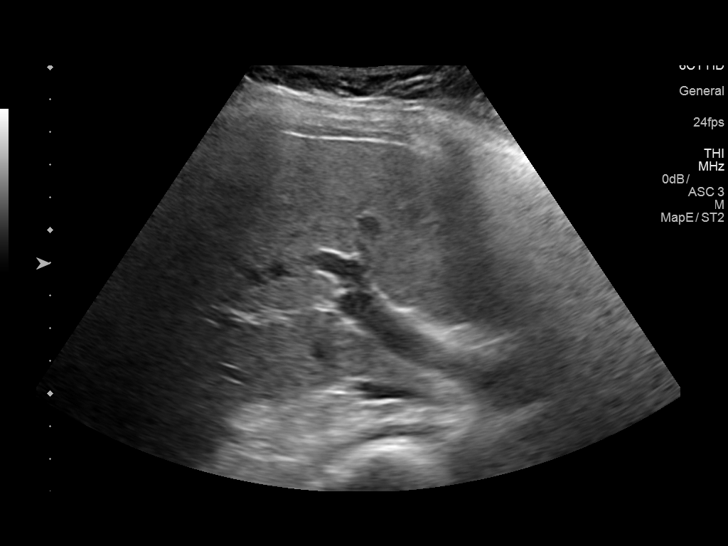

[14 of 25 positions shown; findings below may reference images not displayed]

FINDINGS: Gallbladder:

No gallstones or wall thickening visualized. No sonographic Murphy
sign noted by sonographer.

Common bile duct:

Diameter: 1.9 mm

Liver:

Diffusely increased in echogenicity consistent with fatty
infiltration. No focal mass lesion is noted. Portal vein is patent
on color Doppler imaging with normal direction of blood flow towards
the liver.
IMPRESSION: Fatty liver.

No acute abnormality noted.

## 2019-11-08 DIAGNOSIS — G43009 Migraine without aura, not intractable, without status migrainosus: Secondary | ICD-10-CM | POA: Diagnosis not present

## 2019-11-08 DIAGNOSIS — N946 Dysmenorrhea, unspecified: Secondary | ICD-10-CM | POA: Diagnosis not present

## 2019-11-08 DIAGNOSIS — G629 Polyneuropathy, unspecified: Secondary | ICD-10-CM | POA: Diagnosis not present

## 2019-11-08 DIAGNOSIS — F418 Other specified anxiety disorders: Secondary | ICD-10-CM | POA: Diagnosis not present

## 2019-11-23 ENCOUNTER — Ambulatory Visit: Payer: Medicaid Other | Attending: Internal Medicine

## 2019-11-23 DIAGNOSIS — F418 Other specified anxiety disorders: Secondary | ICD-10-CM | POA: Diagnosis not present

## 2019-11-23 DIAGNOSIS — G43009 Migraine without aura, not intractable, without status migrainosus: Secondary | ICD-10-CM | POA: Diagnosis not present

## 2019-11-23 DIAGNOSIS — G629 Polyneuropathy, unspecified: Secondary | ICD-10-CM | POA: Diagnosis not present

## 2019-11-23 DIAGNOSIS — N946 Dysmenorrhea, unspecified: Secondary | ICD-10-CM | POA: Diagnosis not present

## 2022-04-22 ENCOUNTER — Other Ambulatory Visit: Payer: Self-pay | Admitting: Physician Assistant

## 2022-04-22 DIAGNOSIS — Z1231 Encounter for screening mammogram for malignant neoplasm of breast: Secondary | ICD-10-CM

## 2023-04-07 ENCOUNTER — Other Ambulatory Visit: Payer: Self-pay | Admitting: Physician Assistant

## 2023-04-07 DIAGNOSIS — Z1231 Encounter for screening mammogram for malignant neoplasm of breast: Secondary | ICD-10-CM

## 2023-06-05 ENCOUNTER — Ambulatory Visit: Payer: Medicaid Other
